# Patient Record
Sex: Female | Born: 1995 | Race: Black or African American | Hispanic: No | Marital: Single | Smoking: Never smoker
Health system: Southern US, Community
[De-identification: ages and names within clinical notes are randomized; demographics above are authoritative.]

## PROBLEM LIST (undated history)

## (undated) DIAGNOSIS — J45909 Unspecified asthma, uncomplicated: Secondary | ICD-10-CM

## (undated) HISTORY — DX: Unspecified asthma, uncomplicated: J45.909

---
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
## 2014-08-04 ENCOUNTER — Other Ambulatory Visit: Payer: Self-pay | Admitting: Obstetrics

## 2014-08-04 DIAGNOSIS — N898 Other specified noninflammatory disorders of vagina: Secondary | ICD-10-CM

## 2014-08-04 DIAGNOSIS — N361 Urethral diverticulum: Secondary | ICD-10-CM

## 2014-08-15 ENCOUNTER — Ambulatory Visit
Admission: RE | Admit: 2014-08-15 | Discharge: 2014-08-15 | Disposition: A | Discharge status: Home / Self Care | Payer: 59 | Source: Ambulatory Visit | Attending: Obstetrics | Admitting: Obstetrics

## 2014-08-15 DIAGNOSIS — N898 Other specified noninflammatory disorders of vagina: Secondary | ICD-10-CM

## 2014-08-15 DIAGNOSIS — N361 Urethral diverticulum: Secondary | ICD-10-CM

## 2014-08-15 MED ORDER — GADOBENATE DIMEGLUMINE 529 MG/ML IV SOLN
13.0000 mL | Freq: Once | INTRAVENOUS | Status: AC | PRN
Start: 2014-08-15 — End: 2014-08-15
  Administered 2014-08-15: 13 mL via INTRAVENOUS

## 2014-10-19 ENCOUNTER — Encounter (HOSPITAL_COMMUNITY): Payer: Self-pay | Admitting: *Deleted

## 2014-10-20 ENCOUNTER — Other Ambulatory Visit: Payer: Self-pay | Admitting: Obstetrics

## 2014-10-25 ENCOUNTER — Ambulatory Visit (HOSPITAL_COMMUNITY): Payer: 59 | Admitting: Anesthesiology

## 2014-10-25 ENCOUNTER — Ambulatory Visit (HOSPITAL_COMMUNITY)
Admission: RE | Admit: 2014-10-25 | Discharge: 2014-10-25 | Disposition: A | Discharge status: Home / Self Care | Payer: 59 | Source: Ambulatory Visit | Attending: Obstetrics | Admitting: Obstetrics

## 2014-10-25 ENCOUNTER — Encounter (HOSPITAL_COMMUNITY)
Admission: RE | Disposition: A | Discharge status: Home / Self Care | Payer: Self-pay | Source: Ambulatory Visit | Attending: Obstetrics

## 2014-10-25 DIAGNOSIS — J45909 Unspecified asthma, uncomplicated: Secondary | ICD-10-CM | POA: Insufficient documentation

## 2014-10-25 DIAGNOSIS — N898 Other specified noninflammatory disorders of vagina: Secondary | ICD-10-CM | POA: Diagnosis present

## 2014-10-25 HISTORY — PX: INCISION AND DRAINAGE ABSCESS: SHX5864

## 2014-10-25 LAB — PREGNANCY, URINE: PREG TEST UR: NEGATIVE

## 2014-10-25 LAB — CBC
HEMATOCRIT: 39.2 % (ref 36.0–46.0)
Hemoglobin: 13.8 g/dL (ref 12.0–15.0)
MCH: 30.3 pg (ref 26.0–34.0)
MCHC: 35.2 g/dL (ref 30.0–36.0)
MCV: 86.2 fL (ref 78.0–100.0)
Platelets: 279 10*3/uL (ref 150–400)
RBC: 4.55 MIL/uL (ref 3.87–5.11)
RDW: 12.6 % (ref 11.5–15.5)
WBC: 3.4 10*3/uL — AB (ref 4.0–10.5)

## 2014-10-25 SURGERY — INCISION AND DRAINAGE, ABSCESS
Anesthesia: General | Site: Perineum | Laterality: Left

## 2014-10-25 MED ORDER — FENTANYL CITRATE 0.05 MG/ML IJ SOLN
INTRAMUSCULAR | Status: AC
  Filled 2014-10-25: qty 2

## 2014-10-25 MED ORDER — CEFAZOLIN SODIUM-DEXTROSE 2-3 GM-% IV SOLR
2.0000 g | Freq: Once | INTRAVENOUS | Status: AC
  Administered 2014-10-25: 2 g via INTRAVENOUS
  Filled 2014-10-25: qty 50

## 2014-10-25 MED ORDER — BUPIVACAINE-EPINEPHRINE 0.5% -1:200000 IJ SOLN
INTRAMUSCULAR | Status: DC | PRN
  Administered 2014-10-25: 1 mL

## 2014-10-25 MED ORDER — BUPIVACAINE HCL 0.25 % IJ SOLN
INTRAMUSCULAR | Status: DC | PRN
  Administered 2014-10-25: 6 mL

## 2014-10-25 MED ORDER — DEXAMETHASONE SODIUM PHOSPHATE 4 MG/ML IJ SOLN
INTRAMUSCULAR | Status: AC
  Filled 2014-10-25: qty 1

## 2014-10-25 MED ORDER — PROPOFOL 10 MG/ML IV BOLUS
INTRAVENOUS | Status: DC | PRN
  Administered 2014-10-25: 180 mg via INTRAVENOUS

## 2014-10-25 MED ORDER — DEXAMETHASONE SODIUM PHOSPHATE 10 MG/ML IJ SOLN
INTRAMUSCULAR | Status: DC | PRN
  Administered 2014-10-25: 4 mg via INTRAVENOUS

## 2014-10-25 MED ORDER — MIDAZOLAM HCL 2 MG/2ML IJ SOLN
INTRAMUSCULAR | Status: DC | PRN
  Administered 2014-10-25: 2 mg via INTRAVENOUS

## 2014-10-25 MED ORDER — CEFAZOLIN SODIUM-DEXTROSE 2-3 GM-% IV SOLR
INTRAVENOUS | Status: AC
  Filled 2014-10-25: qty 50

## 2014-10-25 MED ORDER — ONDANSETRON HCL 4 MG/2ML IJ SOLN
INTRAMUSCULAR | Status: DC | PRN
  Administered 2014-10-25: 4 mg via INTRAVENOUS

## 2014-10-25 MED ORDER — FENTANYL CITRATE 0.05 MG/ML IJ SOLN
INTRAMUSCULAR | Status: DC | PRN
  Administered 2014-10-25 (×2): 50 ug via INTRAVENOUS
  Administered 2014-10-25: 100 ug via INTRAVENOUS

## 2014-10-25 MED ORDER — ACETAMINOPHEN 160 MG/5ML PO SOLN
975.0000 mg | Freq: Four times a day (QID) | ORAL | Status: AC | PRN
  Administered 2014-10-25: 975 mg via ORAL

## 2014-10-25 MED ORDER — SCOPOLAMINE 1 MG/3DAYS TD PT72
MEDICATED_PATCH | TRANSDERMAL | Status: AC
  Administered 2014-10-25: 1.5 mg via TRANSDERMAL
  Filled 2014-10-25: qty 1

## 2014-10-25 MED ORDER — METHYLENE BLUE 1 % INJ SOLN
INTRAMUSCULAR | Status: AC
  Filled 2014-10-25: qty 1

## 2014-10-25 MED ORDER — PROPOFOL 10 MG/ML IV BOLUS
INTRAVENOUS | Status: AC
  Filled 2014-10-25: qty 20

## 2014-10-25 MED ORDER — ONDANSETRON HCL 4 MG/2ML IJ SOLN
INTRAMUSCULAR | Status: AC
  Filled 2014-10-25: qty 2

## 2014-10-25 MED ORDER — LACTATED RINGERS IV SOLN
INTRAVENOUS | Status: DC
  Administered 2014-10-25 (×2): via INTRAVENOUS

## 2014-10-25 MED ORDER — ACETAMINOPHEN 160 MG/5ML PO SOLN
ORAL | Status: AC
Start: 2014-10-25 — End: 2014-10-25
  Administered 2014-10-25: 975 mg via ORAL
  Filled 2014-10-25: qty 40.6

## 2014-10-25 MED ORDER — MIDAZOLAM HCL 2 MG/2ML IJ SOLN
INTRAMUSCULAR | Status: AC
  Filled 2014-10-25: qty 2

## 2014-10-25 MED ORDER — SCOPOLAMINE 1 MG/3DAYS TD PT72
1.0000 | MEDICATED_PATCH | Freq: Once | TRANSDERMAL | Status: DC
  Administered 2014-10-25: 1.5 mg via TRANSDERMAL

## 2014-10-25 MED ORDER — BUPIVACAINE HCL (PF) 0.25 % IJ SOLN
INTRAMUSCULAR | Status: AC
  Filled 2014-10-25: qty 30

## 2014-10-25 MED ORDER — BUPIVACAINE LIPOSOME 1.3 % IJ SUSP
20.0000 mL | Freq: Once | INTRAMUSCULAR | Status: DC
  Filled 2014-10-25: qty 20

## 2014-10-25 MED ORDER — LIDOCAINE HCL (CARDIAC) 20 MG/ML IV SOLN
INTRAVENOUS | Status: AC
  Filled 2014-10-25: qty 5

## 2014-10-25 MED ORDER — FENTANYL CITRATE 0.05 MG/ML IJ SOLN: 25.0000 ug | INTRAMUSCULAR | Status: DC | PRN

## 2014-10-25 MED ORDER — LIDOCAINE HCL (CARDIAC) 20 MG/ML IV SOLN
INTRAVENOUS | Status: DC | PRN
  Administered 2014-10-25: 60 mg via INTRAVENOUS

## 2014-10-25 MED ORDER — OXYCODONE-ACETAMINOPHEN 5-325 MG PO TABS: 1.0000 | ORAL_TABLET | Freq: Four times a day (QID) | ORAL | Status: DC | PRN

## 2014-10-25 MED ORDER — BUPIVACAINE-EPINEPHRINE (PF) 0.5% -1:200000 IJ SOLN
INTRAMUSCULAR | Status: AC
  Filled 2014-10-25: qty 30

## 2014-10-25 MED ORDER — KETOROLAC TROMETHAMINE 30 MG/ML IJ SOLN
INTRAMUSCULAR | Status: AC
  Filled 2014-10-25: qty 1

## 2014-10-25 SURGICAL SUPPLY — 34 items
BLADE SURG 11 STRL SS (BLADE) ×3 IMPLANT
CLOTH BEACON ORANGE TIMEOUT ST (SAFETY) ×3 IMPLANT
CONTAINER PREFILL 10% NBF 15ML (MISCELLANEOUS) IMPLANT
COUNTER NEEDLE 1200 MAGNETIC (NEEDLE) IMPLANT
DRAIN PENROSE 1/4X12 LTX (DRAIN) IMPLANT
ELECT REM PT RETURN 9FT ADLT (ELECTROSURGICAL) ×3
ELECTRODE REM PT RTRN 9FT ADLT (ELECTROSURGICAL) ×1 IMPLANT
GAUZE IODOFORM PACK 1/2 7832 (GAUZE/BANDAGES/DRESSINGS) IMPLANT
GAUZE PACKING 1/4X5YD (GAUZE/BANDAGES/DRESSINGS) IMPLANT
GAUZE SPONGE 4X4 16PLY XRAY LF (GAUZE/BANDAGES/DRESSINGS) ×3 IMPLANT
GLOVE BIO SURGEON STRL SZ 6.5 (GLOVE) ×2 IMPLANT
GLOVE BIO SURGEONS STRL SZ 6.5 (GLOVE) ×1
GLOVE BIOGEL PI IND STRL 7.0 (GLOVE) ×2 IMPLANT
GLOVE BIOGEL PI INDICATOR 7.0 (GLOVE) ×4
GOWN STRL REUS W/TWL LRG LVL3 (GOWN DISPOSABLE) ×6 IMPLANT
NEEDLE HYPO 25X1 1.5 SAFETY (NEEDLE) ×3 IMPLANT
NS IRRIG 1000ML POUR BTL (IV SOLUTION) ×3 IMPLANT
PACK VAGINAL MINOR WOMEN LF (CUSTOM PROCEDURE TRAY) ×3 IMPLANT
PAD OB MATERNITY 4.3X12.25 (PERSONAL CARE ITEMS) ×3 IMPLANT
PAD PREP 24X48 CUFFED NSTRL (MISCELLANEOUS) ×3 IMPLANT
PENCIL BUTTON HOLSTER BLD 10FT (ELECTRODE) ×3 IMPLANT
SUT VIC AB 2-0 CT2 27 (SUTURE) ×3 IMPLANT
SUT VICRYL 3 0 CT 3 (SUTURE) ×3 IMPLANT
SUT VICRYL 4-0 PS2 18IN ABS (SUTURE) ×3 IMPLANT
SWAB COLLECTION DEVICE MRSA (MISCELLANEOUS) IMPLANT
SWAB CULTURE LIQ STUART DBL (MISCELLANEOUS) ×3 IMPLANT
SYR 30ML LL (SYRINGE) ×6 IMPLANT
SYR BULB IRRIGATION 50ML (SYRINGE) ×3 IMPLANT
TOWEL OR 17X24 6PK STRL BLUE (TOWEL DISPOSABLE) ×6 IMPLANT
TUBE ANAEROBIC SPECIMEN COL (MISCELLANEOUS) ×3 IMPLANT
TUBING NON-CON 1/4 X 20 CONN (TUBING) ×2 IMPLANT
TUBING NON-CON 1/4 X 20' CONN (TUBING) ×1
WATER STERILE IRR 1000ML POUR (IV SOLUTION) ×3 IMPLANT
YANKAUER SUCT BULB TIP NO VENT (SUCTIONS) ×3 IMPLANT

## 2014-10-25 NOTE — Transfer of Care (Signed)
Immediate Anesthesia Transfer of Care Note  Patient: Jessica Burnett  Procedure(s) Performed: Procedure(s): INCISION AND DRAINAGE ABSCESS/Removal of Left Skene's Gland Cyst (Left)  Patient Location: PACU  Anesthesia Type:General  Level of Consciousness: sedated  Airway & Oxygen Therapy: Patient Spontanous Breathing and Patient connected to nasal cannula oxygen  Post-op Assessment: Report given to RN and Post -op Vital signs reviewed and stable  Post vital signs: stable  Last Vitals:  Filed Vitals:   10/25/14 1224  BP: 114/64  Pulse: 51  Temp: 36.4 C  Resp: 18    Complications: No apparent anesthesia complications

## 2014-10-25 NOTE — Anesthesia Preprocedure Evaluation (Signed)
Anesthesia Evaluation  Patient identified by MRN, date of birth, ID band Patient awake    Reviewed: Allergy & Precautions, H&P , Patient's Chart, lab work & pertinent test results, reviewed documented beta blocker date and time   Airway Mallampati: II  TM Distance: >3 FB Neck ROM: full    Dental no notable dental hx.    Pulmonary asthma ,  breath sounds clear to auscultation  Pulmonary exam normal       Cardiovascular Rhythm:regular Rate:Normal     Neuro/Psych    GI/Hepatic   Endo/Other    Renal/GU      Musculoskeletal   Abdominal   Peds  Hematology   Anesthesia Other Findings   Reproductive/Obstetrics                             Anesthesia Physical Anesthesia Plan  ASA: II  Anesthesia Plan:    Post-op Pain Management:    Induction: Intravenous  Airway Management Planned: LMA  Additional Equipment:   Intra-op Plan:   Post-operative Plan:   Informed Consent: I have reviewed the patients History and Physical, chart, labs and discussed the procedure including the risks, benefits and alternatives for the proposed anesthesia with the patient or authorized representative who has indicated his/her understanding and acceptance.   Dental Advisory Given and Dental advisory given  Plan Discussed with: CRNA and Surgeon  Anesthesia Plan Comments: (Discussed GA with LMA, possible sore throat, potential need to switch to ETT, N/V, pulmonary aspiration. Questions answered. )        Anesthesia Quick Evaluation

## 2014-10-25 NOTE — Anesthesia Postprocedure Evaluation (Signed)
  Anesthesia Post-op Note  Patient: Jessica Burnett  Procedure(s) Performed: Procedure(s): INCISION AND DRAINAGE ABSCESS/Removal of Left Skene's Gland Cyst (Left) Patient is awake and responsive. Pain and nausea are reasonably well controlled. Vital signs are stable and clinically acceptable. Oxygen saturation is clinically acceptable. There are no apparent anesthetic complications at this time. Patient is ready for discharge.

## 2014-10-25 NOTE — Anesthesia Procedure Notes (Signed)
Procedure Name: LMA Insertion Date/Time: 10/25/2014 1:41 PM Performed by: Donnalee CurryMALINOVA, Atif Chapple HRISTOVA Pre-anesthesia Checklist: Patient identified, Emergency Drugs available, Suction available and Patient being monitored Patient Re-evaluated:Patient Re-evaluated prior to inductionOxygen Delivery Method: Circle system utilized Preoxygenation: Pre-oxygenation with 100% oxygen Intubation Type: IV induction Ventilation: Mask ventilation without difficulty LMA: LMA inserted LMA Size: 4.0 Tube secured with: Tape Dental Injury: Teeth and Oropharynx as per pre-operative assessment

## 2014-10-25 NOTE — Op Note (Signed)
10/25/2014  3:31 PM  PATIENT:  Jessica Burnett  18 y.o. female  PRE-OPERATIVE DIAGNOSIS:  Vaginal Wall cyst of unclear etiology,  Pain, Swelling  POST-OPERATIVE DIAGNOSIS:  Vaginal Wall cyst of unclear etiology with abscess,  Pain, Swelling  PROCEDURE:  Excision of Vaginal wall cyst  SURGEON:  Surgeon(s) and Role:    * Noland Fordyce, MD - Primary  PHYSICIAN ASSISTANT: none  ASSISTANTS: none   ANESTHESIA:   local and general  EBL:  Total I/O In: 1800 [I.V.:1800] Out: 200 [Urine:125; Blood:75]  BLOOD ADMINISTERED:none  DRAINS: none   LOCAL MEDICATIONS USED:  LIDOCAINE   SPECIMEN:  Source of Specimen:  vaginal wall cyst  DISPOSITION OF SPECIMEN:  PATHOLOGY  COUNTS:  YES  TOURNIQUET:  * No tourniquets in log *  DICTATION: .Dragon Dictation  PLAN OF CARE: Discharge to home after PACU  PATIENT DISPOSITION:  PACU - hemodynamically stable.   Delay start of Pharmacological VTE agent (>24hrs) due to surgical blood loss or risk of bleeding: yes   Antibiotics: 2 g Ancef Complications: None Findings: 3 cm soft anterior vaginal wall cyst extending from the medial urethra to the lateral labia minora. Cyst filled with purulent fluid without odor or blood. Intact urethra post procedure.  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 Given symptomatic pain and swelling causing immobility twice a year patient opted for removal of cystic lesion of unclear etiology to reduce recurrence.  Procedure: After informed sensitivity and patient taken the operating room where general anesthesia was machine without typically she is prepped and draped in normal sterile fashion in the dorsal supine lithotomy position. A Foley catheter was inserted into the bladder. Traction was used on the Foley to help identify the course of urethra which was then deviated away from the lesion. Surgical mapping was carried out in the  planned incision was marked with a marking pen. Half percent Marcaine with 1 200,000 units of epinephrine was infused under the planned incision site. A 1-1/2 cm incision was made with a #15 blade in the medial aspect of the left labia minora. Using Metzenbaum scissors and retraction both with DeBakey forceps and Allis clamps as well as blunt dissection the 3 cm cyst was dissected out from the underlying tissue. Time for dissection was about 1 hour. Great care was taken to avoid entering the urethra which was located on the medial aspect of this cystic lesion. Very minimal Bovie cautery use was had on the lateral aspect of the dissection to control any bleeding but no cautery was used on the medial aspect at the border of the urethra. Just prior to completely dissecting out the cyst with the last area of attachment medial and deep,  the cyst wall was inadvertently entered and drainage of purulent fluid. Copious irrigation was done. The remainder of the cyst was dissected out completely. At this point the urethra was backfilled through the Foley catheter with methylene blue and saline. No blue dye was seen entering the dissection area. Certainly the Foley catheter was not seen either. It was felt that the urethra was not entered during the careful dissection of the cyst wall. Once the cyst was removed a potential space was left and this was closed in 3 layer closure with 2-0 Vicryl repeat in interrupted figure-of-eight sutures. Hemostasis was noted. A 3-0 Vicryl was used to close the medial labial incision and 2 additional figure-of-eight's with a 4-0 Vicryl were used.  10 cc of 1% plain lidocaine was infused at the end of the procedure. The urethra was palpated again, the vagina was palpated and and no inadvertent injury was noted good cosmetic appearance at the end of the procedure. Sponge and needle counts were correct 3 and patient was taken to the recovery room in a stable condition.  Jessica Burnett  A. 10/25/2014 3:46 PM

## 2014-10-25 NOTE — H&P (Signed)
CC: for removal of L and possibly R vaginal wall cyst  HPI: 19 yo G0 with several year h/o recurrent anterior vaginal wall cyst. L>R, swells and painful twice yearly. Unclear etiology. 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 NO evidence for urethral diverticulum. MRI shows cystic lesions in anterior vaginal wall, at level of urethra- R: 2.9 x 1.8c cm; L 9 x 8 mm.   Past Medical History  Diagnosis Date  . Asthma     History reviewed. No pertinent past surgical history.   NKDA  PE: Filed Vitals:   10/19/14 1038 10/25/14 1224  BP:  114/64  Pulse:  51  Temp:  97.5 F (36.4 C)  TempSrc:  Oral  Resp:  18  Height: 5\' 2"  (1.575 m)   Weight: 68.04 kg (150 lb)   SpO2:  100%   Gen: well appearing, no distress Abd: soft, NT, ND GU: def to OR LE: no edema  CBC    Component Value Date/Time   WBC 3.4* 10/25/2014 1220   RBC 4.55 10/25/2014 1220   HGB 13.8 10/25/2014 1220   HCT 39.2 10/25/2014 1220   PLT 279 10/25/2014 1220   MCV 86.2 10/25/2014 1220   MCH 30.3 10/25/2014 1220   MCHC 35.2 10/25/2014 1220   RDW 12.6 10/25/2014 1220    A/P:: 19 yo G0 for removal of vaginal wall cyst. Pt aware risks pain, bleeding, scarring, dyspareunia, injury to bladder and urethra.  Chinita Schimpf A. 10/25/2014 1:32 PM

## 2014-10-25 NOTE — Brief Op Note (Signed)
10/25/2014  3:31 PM  PATIENT:  Jessica Burnett  18 y.o. female  PRE-OPERATIVE DIAGNOSIS:  Vaginal Wall cyst of unclear etiology,  Pain, Swelling  POST-OPERATIVE DIAGNOSIS:  Vaginal Wall cyst of unclear etiology with abscess,  Pain, Swelling  PROCEDURE:  Excision of Vaginal wall cyst  SURGEON:  Surgeon(s) and Role:    * Noland FordyceKelly Jannely Henthorn, MD - Primary  PHYSICIAN ASSISTANT: none  ASSISTANTS: none   ANESTHESIA:   local and general  EBL:  Total I/O In: 1800 [I.V.:1800] Out: 200 [Urine:125; Blood:75]  BLOOD ADMINISTERED:none  DRAINS: none   LOCAL MEDICATIONS USED:  LIDOCAINE   SPECIMEN:  Source of Specimen:  vaginal wall cyst  DISPOSITION OF SPECIMEN:  PATHOLOGY  COUNTS:  YES  TOURNIQUET:  * No tourniquets in log *  DICTATION: .Dragon Dictation  PLAN OF CARE: Discharge to home after PACU  PATIENT DISPOSITION:  PACU - hemodynamically stable.   Delay start of Pharmacological VTE agent (>24hrs) due to surgical blood loss or risk of bleeding: yes

## 2014-10-25 NOTE — Discharge Instructions (Signed)
Incision and Drainage Care After Refer to this sheet in the next few weeks. These instructions provide you with information on caring for yourself after your procedure. Your caregiver may also give you more specific instructions. Your treatment has been planned according to current medical practices, but problems sometimes occur. Call your caregiver if you have any problems or questions after your procedure. HOME CARE INSTRUCTIONS   If antibiotic medicine is given, take it as directed. Finish it even if you start to feel better.  Only take over-the-counter or prescription medicines for pain, discomfort, or fever as directed by your caregiver.  Keep all follow-up appointments as directed by your caregiver.  Change any bandages (dressings) as directed by your caregiver. Replace old dressings with clean dressings.  Wash your hands before and after caring for your wound. You will receive specific instructions for cleansing and caring for your wound.  SEEK MEDICAL CARE IF:   You have increased pain, swelling, or redness around the wound.  You have increased drainage, smell, or bleeding from the wound.  You have muscle aches, chills, or you feel generally sick.  You have a fever. MAKE SURE YOU:   Understand these instructions.  Will watch your condition.  Will get help right away if you are not doing well or get worse. Document Released: 10/29/2011 Document Reviewed: 10/29/2011 [facility] Patient Information 2015 [place]. This information is not intended to replace advice given to you by your health care provider. Make sure you discuss any questions you have with your health care provider.  May take sitz baths for comfort.  No pools or Jacuzzi's. (communal use) May begin ibuprofen 600 mgs every 6 hrs, or 800 mgs every 8 hrs at noon tomorrow. No aspirin No driving for 24 hrs or for as long as taking narcotic medication. Begin with light diet, advance as tolerated.  Post  Anesthesia Home Care Instructions  Activity: Get plenty of rest for the remainder of the day. A responsible adult should stay with you for 24 hours following the procedure.  For the next 24 hours, DO NOT: -Drive a car -Advertising copywriterperate machinery -Drink alcoholic beverages -Take any medication unless instructed by your physician -Make any legal decisions or sign important papers.  Meals: Regular foods as tolerated. Avoid greasy, spicy, heavy foods. If nausea and/or vomiting occur, drink only clear liquids until the nausea and/or vomiting subsides. Call your physician if vomiting continues.  Special Instructions/Symptoms: Your throat may feel dry or sore from the anesthesia or the breathing tube placed in your throat during surgery. If this causes discomfort, gargle with warm salt water. The discomfort should disappear within 24 hours.

## 2014-10-26 ENCOUNTER — Encounter (HOSPITAL_COMMUNITY): Payer: Self-pay | Admitting: Obstetrics

## 2014-10-29 LAB — CULTURE, ROUTINE-ABSCESS: CULTURE: NO GROWTH

## 2014-10-31 LAB — ANAEROBIC CULTURE

## 2015-07-20 ENCOUNTER — Encounter (HOSPITAL_COMMUNITY): Payer: Self-pay | Admitting: *Deleted

## 2015-07-22 ENCOUNTER — Other Ambulatory Visit: Payer: Self-pay | Admitting: Obstetrics

## 2015-08-03 ENCOUNTER — Encounter (HOSPITAL_COMMUNITY): Payer: Self-pay | Admitting: Anesthesiology

## 2015-08-03 NOTE — Anesthesia Preprocedure Evaluation (Addendum)
Anesthesia Evaluation  Patient identified by MRN, date of birth, ID band Patient awake    Reviewed: Allergy & Precautions, NPO status , Patient's Chart, lab work & pertinent test results  Airway Mallampati: II  TM Distance: >3 FB     Dental  (+) Teeth Intact   Pulmonary asthma ,    Pulmonary exam normal breath sounds clear to auscultation       Cardiovascular negative cardio ROS Normal cardiovascular exam Rhythm:Regular Rate:Normal     Neuro/Psych negative neurological ROS  negative psych ROS   GI/Hepatic negative GI ROS, Neg liver ROS,   Endo/Other  negative endocrine ROS  Renal/GU negative Renal ROS  negative genitourinary   Musculoskeletal negative musculoskeletal ROS (+)   Abdominal (+) - obese,   Peds  Hematology negative hematology ROS (+)   Anesthesia Other Findings   Reproductive/Obstetrics Vaginal wall cyst                            Anesthesia Physical Anesthesia Plan  ASA: I  Anesthesia Plan: General   Post-op Pain Management:    Induction: Intravenous  Airway Management Planned: LMA  Additional Equipment:   Intra-op Plan:   Post-operative Plan:   Informed Consent: I have reviewed the patients History and Physical, chart, labs and discussed the procedure including the risks, benefits and alternatives for the proposed anesthesia with the patient or authorized representative who has indicated his/her understanding and acceptance.   Dental advisory given  Plan Discussed with: CRNA, Anesthesiologist and Surgeon  Anesthesia Plan Comments:         Anesthesia Quick Evaluation

## 2015-08-04 ENCOUNTER — Ambulatory Visit (HOSPITAL_COMMUNITY)
Admission: RE | Admit: 2015-08-04 | Discharge: 2015-08-04 | Disposition: A | Discharge status: Home / Self Care | Payer: 59 | Source: Ambulatory Visit | Attending: Obstetrics | Admitting: Obstetrics

## 2015-08-04 ENCOUNTER — Ambulatory Visit (HOSPITAL_COMMUNITY): Payer: 59 | Admitting: Anesthesiology

## 2015-08-04 ENCOUNTER — Encounter (HOSPITAL_COMMUNITY)
Admission: RE | Disposition: A | Discharge status: Home / Self Care | Payer: Self-pay | Source: Ambulatory Visit | Attending: Obstetrics

## 2015-08-04 DIAGNOSIS — Q524 Other congenital malformations of vagina: Secondary | ICD-10-CM | POA: Insufficient documentation

## 2015-08-04 DIAGNOSIS — J45909 Unspecified asthma, uncomplicated: Secondary | ICD-10-CM | POA: Diagnosis not present

## 2015-08-04 HISTORY — PX: EXCISION VAGINAL CYST: SHX5825

## 2015-08-04 LAB — CBC WITH DIFFERENTIAL/PLATELET
BASOS ABS: 0 10*3/uL (ref 0.0–0.1)
Basophils Relative: 0 %
EOS ABS: 0.2 10*3/uL (ref 0.0–0.7)
EOS PCT: 6 %
HCT: 39.5 % (ref 36.0–46.0)
Hemoglobin: 13.9 g/dL (ref 12.0–15.0)
LYMPHS ABS: 1.3 10*3/uL (ref 0.7–4.0)
Lymphocytes Relative: 33 %
MCH: 30 pg (ref 26.0–34.0)
MCHC: 35.2 g/dL (ref 30.0–36.0)
MCV: 85.3 fL (ref 78.0–100.0)
Monocytes Absolute: 0.4 10*3/uL (ref 0.1–1.0)
Monocytes Relative: 9 %
NEUTROS PCT: 52 %
Neutro Abs: 2.1 10*3/uL (ref 1.7–7.7)
PLATELETS: 250 10*3/uL (ref 150–400)
RBC: 4.63 MIL/uL (ref 3.87–5.11)
RDW: 12.9 % (ref 11.5–15.5)
WBC: 3.9 10*3/uL — ABNORMAL LOW (ref 4.0–10.5)

## 2015-08-04 LAB — PREGNANCY, URINE: Preg Test, Ur: NEGATIVE

## 2015-08-04 SURGERY — EXCISION, CYST, VAGINA
Anesthesia: General | Site: Vagina | Laterality: Right

## 2015-08-04 MED ORDER — DEXAMETHASONE SODIUM PHOSPHATE 4 MG/ML IJ SOLN
INTRAMUSCULAR | Status: AC
  Filled 2015-08-04: qty 1

## 2015-08-04 MED ORDER — FENTANYL CITRATE (PF) 100 MCG/2ML IJ SOLN
INTRAMUSCULAR | Status: DC | PRN
  Administered 2015-08-04 (×2): 25 ug via INTRAVENOUS
  Administered 2015-08-04 (×2): 50 ug via INTRAVENOUS
  Administered 2015-08-04 (×2): 25 ug via INTRAVENOUS

## 2015-08-04 MED ORDER — PHENYLEPHRINE 40 MCG/ML (10ML) SYRINGE FOR IV PUSH (FOR BLOOD PRESSURE SUPPORT)
PREFILLED_SYRINGE | INTRAVENOUS | Status: AC
  Filled 2015-08-04: qty 10

## 2015-08-04 MED ORDER — GLYCOPYRROLATE 0.2 MG/ML IJ SOLN
INTRAMUSCULAR | Status: AC
Start: 2015-08-04 — End: 2015-08-04
  Filled 2015-08-04: qty 1

## 2015-08-04 MED ORDER — BUPIVACAINE-EPINEPHRINE 0.5% -1:200000 IJ SOLN
INTRAMUSCULAR | Status: DC | PRN
  Administered 2015-08-04: 8 mL
  Administered 2015-08-04: 7 mL
  Administered 2015-08-04: 3 mL

## 2015-08-04 MED ORDER — NORGESTIMATE-ETH ESTRADIOL 0.25-35 MG-MCG PO TABS
1.0000 | ORAL_TABLET | Freq: Every day | ORAL | Status: DC
Start: 2015-08-04 — End: 2019-10-07

## 2015-08-04 MED ORDER — FENTANYL CITRATE (PF) 100 MCG/2ML IJ SOLN: 25.0000 ug | INTRAMUSCULAR | Status: DC | PRN

## 2015-08-04 MED ORDER — PROPOFOL 10 MG/ML IV BOLUS
INTRAVENOUS | Status: AC
  Filled 2015-08-04: qty 40

## 2015-08-04 MED ORDER — LIDOCAINE HCL (CARDIAC) 20 MG/ML IV SOLN
INTRAVENOUS | Status: AC
  Filled 2015-08-04: qty 5

## 2015-08-04 MED ORDER — SCOPOLAMINE 1 MG/3DAYS TD PT72
1.0000 | MEDICATED_PATCH | Freq: Once | TRANSDERMAL | Status: DC
  Administered 2015-08-04: 1.5 mg via TRANSDERMAL

## 2015-08-04 MED ORDER — BUPIVACAINE-EPINEPHRINE (PF) 0.5% -1:200000 IJ SOLN
INTRAMUSCULAR | Status: AC
  Filled 2015-08-04: qty 30

## 2015-08-04 MED ORDER — PHENYLEPHRINE HCL 10 MG/ML IJ SOLN
INTRAMUSCULAR | Status: DC | PRN
  Administered 2015-08-04 (×2): 40 ug via INTRAVENOUS

## 2015-08-04 MED ORDER — MEPERIDINE HCL 25 MG/ML IJ SOLN: 6.2500 mg | INTRAMUSCULAR | Status: DC | PRN

## 2015-08-04 MED ORDER — DEXAMETHASONE SODIUM PHOSPHATE 4 MG/ML IJ SOLN
INTRAMUSCULAR | Status: DC | PRN
  Administered 2015-08-04: 8 mg via INTRAVENOUS

## 2015-08-04 MED ORDER — PROPOFOL 10 MG/ML IV BOLUS
INTRAVENOUS | Status: AC
  Filled 2015-08-04: qty 20

## 2015-08-04 MED ORDER — MIDAZOLAM HCL 2 MG/2ML IJ SOLN
INTRAMUSCULAR | Status: AC
  Filled 2015-08-04: qty 2

## 2015-08-04 MED ORDER — OXYCODONE-ACETAMINOPHEN 5-325 MG PO TABS: 2.0000 | ORAL_TABLET | Freq: Four times a day (QID) | ORAL | Status: DC | PRN

## 2015-08-04 MED ORDER — CEFAZOLIN SODIUM-DEXTROSE 2-3 GM-% IV SOLR
INTRAVENOUS | Status: AC
  Administered 2015-08-04: 2 g via INTRAVENOUS
  Filled 2015-08-04: qty 50

## 2015-08-04 MED ORDER — SCOPOLAMINE 1 MG/3DAYS TD PT72
MEDICATED_PATCH | TRANSDERMAL | Status: AC
  Administered 2015-08-04: 1.5 mg via TRANSDERMAL
  Filled 2015-08-04: qty 1

## 2015-08-04 MED ORDER — CEFAZOLIN SODIUM-DEXTROSE 2-3 GM-% IV SOLR: 2.0000 g | INTRAVENOUS | Status: DC

## 2015-08-04 MED ORDER — MIDAZOLAM HCL 2 MG/2ML IJ SOLN
INTRAMUSCULAR | Status: DC | PRN
  Administered 2015-08-04: 2 mg via INTRAVENOUS

## 2015-08-04 MED ORDER — ONDANSETRON HCL 4 MG/2ML IJ SOLN
INTRAMUSCULAR | Status: DC | PRN
  Administered 2015-08-04: 4 mg via INTRAVENOUS

## 2015-08-04 MED ORDER — LIDOCAINE HCL (CARDIAC) 20 MG/ML IV SOLN
INTRAVENOUS | Status: DC | PRN
  Administered 2015-08-04: 80 mg via INTRAVENOUS

## 2015-08-04 MED ORDER — LACTATED RINGERS IV SOLN
INTRAVENOUS | Status: DC
  Administered 2015-08-04 (×4): via INTRAVENOUS

## 2015-08-04 MED ORDER — ONDANSETRON HCL 4 MG/2ML IJ SOLN
INTRAMUSCULAR | Status: AC
  Filled 2015-08-04: qty 2

## 2015-08-04 MED ORDER — METHYLENE BLUE 1 % INJ SOLN
INTRAMUSCULAR | Status: AC
  Filled 2015-08-04: qty 10

## 2015-08-04 MED ORDER — FENTANYL CITRATE (PF) 250 MCG/5ML IJ SOLN
INTRAMUSCULAR | Status: AC
  Filled 2015-08-04: qty 5

## 2015-08-04 MED ORDER — PROPOFOL 10 MG/ML IV BOLUS
INTRAVENOUS | Status: DC | PRN
  Administered 2015-08-04: 150 mg via INTRAVENOUS

## 2015-08-04 MED ORDER — GLYCOPYRROLATE 0.2 MG/ML IJ SOLN
INTRAMUSCULAR | Status: DC | PRN
  Administered 2015-08-04: 0.2 mg via INTRAVENOUS

## 2015-08-04 MED ORDER — HYDROCODONE-ACETAMINOPHEN 7.5-325 MG PO TABS: 1.0000 | ORAL_TABLET | Freq: Once | ORAL | Status: DC | PRN

## 2015-08-04 MED ORDER — KETOROLAC TROMETHAMINE 30 MG/ML IJ SOLN
INTRAMUSCULAR | Status: AC
  Filled 2015-08-04: qty 1

## 2015-08-04 MED ORDER — PROMETHAZINE HCL 25 MG/ML IJ SOLN: 6.2500 mg | INTRAMUSCULAR | Status: DC | PRN

## 2015-08-04 MED ORDER — FENTANYL CITRATE (PF) 250 MCG/5ML IJ SOLN
INTRAMUSCULAR | Status: AC
Start: 2015-08-04 — End: 2015-08-04
  Filled 2015-08-04: qty 5

## 2015-08-04 SURGICAL SUPPLY — 32 items
APPLICATOR COTTON TIP 6IN STRL (MISCELLANEOUS) IMPLANT
BLADE SURG 11 STRL SS (BLADE) ×3 IMPLANT
BLADE SURG 15 STRL LF C SS BP (BLADE) ×1 IMPLANT
BLADE SURG 15 STRL SS (BLADE) ×2
CATH BARTHOLIN GLAND 10FR 5CM (CATHETERS) ×3 IMPLANT
CLOTH BEACON ORANGE TIMEOUT ST (SAFETY) ×3 IMPLANT
CONTAINER PREFILL 10% NBF 15ML (MISCELLANEOUS) ×3 IMPLANT
COUNTER NEEDLE 1200 MAGNETIC (NEEDLE) ×3 IMPLANT
ELECT REM PT RETURN 9FT ADLT (ELECTROSURGICAL) ×3
ELECTRODE REM PT RTRN 9FT ADLT (ELECTROSURGICAL) ×1 IMPLANT
GAUZE IODOFORM PACK 1/2 7832 (GAUZE/BANDAGES/DRESSINGS) IMPLANT
GAUZE PACKING 1/4X5YD (GAUZE/BANDAGES/DRESSINGS) IMPLANT
GLOVE BIOGEL M 6.5 STRL (GLOVE) ×6 IMPLANT
GLOVE BIOGEL PI IND STRL 7.0 (GLOVE) ×2 IMPLANT
GLOVE BIOGEL PI INDICATOR 7.0 (GLOVE) ×4
GOWN STRL REUS W/TWL LRG LVL3 (GOWN DISPOSABLE) ×6 IMPLANT
NEEDLE HYPO 22GX1.5 SAFETY (NEEDLE) ×3 IMPLANT
NS IRRIG 1000ML POUR BTL (IV SOLUTION) IMPLANT
PACK VAGINAL MINOR WOMEN LF (CUSTOM PROCEDURE TRAY) ×3 IMPLANT
PAD OB MATERNITY 4.3X12.25 (PERSONAL CARE ITEMS) ×3 IMPLANT
PENCIL BUTTON HOLSTER BLD 10FT (ELECTRODE) ×3 IMPLANT
SPONGE SURGIFOAM ABS GEL 12-7 (HEMOSTASIS) ×3 IMPLANT
SUT VIC AB 2-0 CT2 27 (SUTURE) IMPLANT
SUT VIC AB 3-0 SH 27 (SUTURE) ×2
SUT VIC AB 3-0 SH 27X BRD (SUTURE) ×1 IMPLANT
SYR BULB IRRIGATION 50ML (SYRINGE) IMPLANT
SYRINGE IRR TOOMEY STRL 70CC (SYRINGE) ×3 IMPLANT
TOWEL OR 17X24 6PK STRL BLUE (TOWEL DISPOSABLE) ×6 IMPLANT
TRAY FOLEY CATH SILVER 14FR (SET/KITS/TRAYS/PACK) ×3 IMPLANT
TUBING NON-CON 1/4 X 20 CONN (TUBING) ×2 IMPLANT
TUBING NON-CON 1/4 X 20' CONN (TUBING) ×1
YANKAUER SUCT BULB TIP NO VENT (SUCTIONS) ×3 IMPLANT

## 2015-08-04 NOTE — Discharge Instructions (Signed)

## 2015-08-04 NOTE — Anesthesia Procedure Notes (Signed)
Procedure Name: LMA Insertion Date/Time: 08/04/2015 9:41 AM Performed by: Earmon PhoenixWILKERSON, Maximina Pirozzi P Pre-anesthesia Checklist: Patient identified, Patient being monitored, Timeout performed, Emergency Drugs available and Suction available Patient Re-evaluated:Patient Re-evaluated prior to inductionOxygen Delivery Method: Circle system utilized Preoxygenation: Pre-oxygenation with 100% oxygen Intubation Type: IV induction Ventilation: Mask ventilation without difficulty LMA: LMA inserted LMA Size: 4.0 Number of attempts: 1 Placement Confirmation: ETT inserted through vocal cords under direct vision,  positive ETCO2,  CO2 detector and breath sounds checked- equal and bilateral Tube secured with: Tape Dental Injury: Teeth and Oropharynx as per pre-operative assessment

## 2015-08-04 NOTE — Transfer of Care (Signed)
Immediate Anesthesia Transfer of Care Note  Patient: Jessica Burnett  Procedure(s) Performed: Procedure(s): EXCISION RIGHT VAGINAL Wall CYST (Right)  Patient Location: PACU  Anesthesia Type:General  Level of Consciousness: awake  Airway & Oxygen Therapy: Patient Spontanous Breathing  Post-op Assessment: Report given to PACU RN  Post vital signs: stable  Filed Vitals:   08/04/15 0841  BP: 98/63  Pulse: 56  Temp: 36.8 C  Resp: 18    Complications: No apparent anesthesia complications

## 2015-08-04 NOTE — Op Note (Signed)
PATIENT: Jessica Burnett 19 y.o. 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, Pain, Swelling  POST-OPERATIVE DIAGNOSIS: Vaginal Wall cyst of  likely Mullerian etiology,, Pain, Swelling  PROCEDURE: Excision of Vaginal wall cyst  SURGEON: Surgeon(s) and Role:  * Noland FordyceKelly Kristalyn Bergstresser, MD - Primary  PHYSICIAN ASSISTANT: none  ASSISTANTS: none   ANESTHESIA: local and general  EBL: 350cc  BLOOD ADMINISTERED:none  DRAINS: foley  LOCAL MEDICATIONS USED: 1/2% marcaine with 1:200k epi, 20 cc  SPECIMEN: Source of Specimen: vaginal wall cyst  DISPOSITION OF SPECIMEN: PATHOLOGY  COUNTS: YES  TOURNIQUET: * No tourniquets in log *  DICTATION: .Dragon Dictation  PLAN OF CARE: Discharge to home after PACU  PATIENT DISPOSITION: PACU - hemodynamically stable.  Delay start of Pharmacological VTE agent (>24hrs) due to surgical blood loss or risk of bleeding: yes   Antibiotics: 2 g Ancef Complications: None Findings: 3 cm soft anterior vaginal wall cyst extending from the medial urethra to the lateral right labia minora. Cyst filled with purulent fluid without odor or blood. Intact urethra post procedure.  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 S/p resection on left side and desiring resection on right side. Given symptomatic pain and swelling causing immobility twice a year patient opted for removal of cystic lesion of unclear etiology to reduce recurrence.  Procedure: After informed sensitivity and patient taken the operating room where general anesthesia was initiated she is prepped and draped in normal sterile fashion in the dorsal supine lithotomy position. A Foley catheter was inserted into the bladder. Traction was used on the Foley to help identify the course of urethra which was then deviated away from the lesion. Surgical mapping was carried  out in the planned incision was marked with a marking pen. Half percent Marcaine with 1 200,000 units of epinephrine was infused under the planned incision site. A 1-1/2 cm incision was made with a #15 blade in the medial aspect of the left labia minora. The cyst ruptured almost immediately with drainage of a purulent material. A word catheter was placed into the cyst for better visualization of the borders.  Using Metzenbaum scissors and retraction both with DeBakey forceps and Allis clamps as well as blunt dissection the 3 cm cyst was dissected out from the underlying tissue. Time for dissection was about 1 hour. Great care was taken to avoid entering the urethra which was located on the medial aspect of this cystic lesion. Very minimal Bovie cautery use was had on the lateral aspect of the dissection to control any bleeding but no cautery was used on the medial aspect at the border of the urethra.  Copious irrigation was done. The cyst was dissected out completely. At this point the urethra was backfilled through the Foley catheter with methylene blue and saline. No blue dye was seen entering the dissection area. Certainly the Foley catheter was not seen either. It was felt that the urethra was not entered during the careful dissection of the cyst wall. Once the cyst was removed a potential space was left and moderated bleeding throughout the case from this space. 4 figure of 8 sutures of 3-0 vicryl were used for hemostasis,  A 3-0 Vicryl was used to close the medial labial incision in a running fashion.  10 cc of injection was infused at the end of the procedure. A small labial skin tag was removed sharply after first injecting with marcaine.  Hemostasis with cautery. The urethra was palpated again, the vagina was palpated and and no inadvertent injury was noted good cosmetic appearance at the end of the procedure. Sponge and needle counts were correct 3 and patient was taken to the recovery room in a stable  condition.

## 2015-08-04 NOTE — Anesthesia Postprocedure Evaluation (Signed)
Anesthesia Post Note  Patient: Jessica Burnett  Procedure(s) Performed: Procedure(s) (LRB): EXCISION RIGHT VAGINAL Wall CYST (Right)  Patient location during evaluation: PACU Anesthesia Type: General Level of consciousness: awake and alert and oriented Pain management: pain level controlled Vital Signs Assessment: post-procedure vital signs reviewed and stable Respiratory status: spontaneous breathing, nonlabored ventilation and respiratory function stable Cardiovascular status: blood pressure returned to baseline Postop Assessment: no signs of nausea or vomiting    Last Vitals:  Filed Vitals:   08/04/15 1245 08/04/15 1300  BP: 101/62 95/59  Pulse: 58 57  Temp:    Resp: 12 12    Last Pain: There were no vitals filed for this visit.               Jolan Mealor A.

## 2015-08-04 NOTE — Brief Op Note (Signed)
08/04/2015  11:43 AM  PATIENT:  Jessica Burnett  19 y.o. female  PRE-OPERATIVE DIAGNOSIS:  Right Vaginal Wall Cyst with pain and swelling  POST-OPERATIVE DIAGNOSIS:  Right Vaginal Wall Cyst with pain and swelling  PROCEDURE:  Procedure(s): EXCISION RIGHT VAGINAL Wall CYST (Right)  SURGEON:  Surgeon(s) and Role:    * Noland FordyceKelly Asanti Craigo, MD - Primary  PHYSICIAN ASSISTANT:   ASSISTANTS: none   ANESTHESIA:   local and general  EBL:  Total I/O In: 2000 [I.V.:2000] Out: 400 [Blood:400]  BLOOD ADMINISTERED:none  DRAINS: Urinary Catheter (Foley)   LOCAL MEDICATIONS USED:  MARCAINE, 0.5% with 1/200k epineprhibne     and Amount: 20 ml  SPECIMEN:  Source of Specimen:  vaginal wall cyst  DISPOSITION OF SPECIMEN:  PATHOLOGY  COUNTS:  YES  TOURNIQUET:  * No tourniquets in log *  DICTATION: .Note written in EPIC  PLAN OF CARE: Discharge to home after PACU  PATIENT DISPOSITION:  PACU - hemodynamically stable.   Delay start of Pharmacological VTE agent (>24hrs) due to surgical blood loss or risk of bleeding: yes

## 2015-08-04 NOTE — H&P (Signed)
CC: Enlarging R Labial Mullerian Gland cyst  HPI: 19 yo NP G0 for resection of r Mullerian gland cyst. Pt with long h/o b/l labial cysts that grow about 2/ yr. 3/16 had resection of L cyst after MRI and urology evaluation. Final pathology- Mullerian gland cyst. At that time, she had a similar but smaller cyst on the R side, now enlarging and pt would like resection.  Past Medical History  Diagnosis Date  . Asthma     Past Surgical History  Procedure Laterality Date  . Incision and drainage abscess Left 10/25/2014    Procedure: INCISION AND DRAINAGE ABSCESS/Removal of Left Skene's Gland Cyst;  Surgeon: Noland FordyceKelly Makai Agostinelli, MD;  Location: [facility];  Service: Gynecology;  Laterality: Left;   NKDA Meds: albuterol prn  PE: Filed Vitals:   08/04/15 0841  BP: 98/63  Pulse: 56  Temp: 98.2 F (36.8 C)  TempSrc: Oral  Resp: 18  Height: 5\' 1"  (1.549 m)  Weight: 66.225 kg (146 lb)  SpO2: 100%   Gen: well appearing, no distress Abd: soft, NT, ND GU: def to OR  A/P: R sided Mullerian gland cyst, for resection, pt aware R/B especially given close proximity to urethra, pt understands risks if urethra entered.  Plan OC post-op to decrease menstrual bleeding.   Ammarie Matsuura A. 08/04/2015 9:28 AM

## 2015-08-05 ENCOUNTER — Encounter (HOSPITAL_COMMUNITY): Payer: Self-pay | Admitting: Obstetrics

## 2015-10-10 ENCOUNTER — Ambulatory Visit: Payer: 59 | Admitting: Family Medicine

## 2019-08-21 NOTE — L&D Delivery Note (Signed)
Delivery Note At 10:16 PM a viable female was delivered via Vaginal, Spontaneous (Presentation:   Occiput Anterior).  APGAR: 8, 9; weight  .   Placenta status: Spontaneous, Intact.  Cord: 3 vessels with the following complications: None.  Cord pH: n/a  Anesthesia: Epidural Episiotomy: None Lacerations: 2nd degree;Sulcus Suture Repair: 2.0 3.0 vicryl rapide Est. Blood Loss (mL): 400  Mom to postpartum.  Baby to Couplet care / Skin to Skin.  Lendon Colonel 06/05/2020, 10:40 PM

## 2019-10-15 ENCOUNTER — Telehealth: Payer: Self-pay

## 2019-10-15 NOTE — Telephone Encounter (Signed)

## 2019-10-16 ENCOUNTER — Ambulatory Visit (INDEPENDENT_AMBULATORY_CARE_PROVIDER_SITE_OTHER): Payer: BLUE CROSS/BLUE SHIELD | Admitting: Internal Medicine

## 2019-10-16 ENCOUNTER — Other Ambulatory Visit: Payer: Self-pay

## 2019-10-16 ENCOUNTER — Encounter: Payer: Self-pay | Admitting: Internal Medicine

## 2019-10-16 VITALS — BP 114/75 | HR 65 | Temp 97.2°F | Resp 17 | Ht 62.5 in | Wt 153.0 lb

## 2019-10-16 DIAGNOSIS — N926 Irregular menstruation, unspecified: Secondary | ICD-10-CM

## 2019-10-16 DIAGNOSIS — Z7689 Persons encountering health services in other specified circumstances: Secondary | ICD-10-CM

## 2019-10-16 DIAGNOSIS — Z3A01 Less than 8 weeks gestation of pregnancy: Secondary | ICD-10-CM

## 2019-10-16 DIAGNOSIS — Z114 Encounter for screening for human immunodeficiency virus [HIV]: Secondary | ICD-10-CM | POA: Diagnosis not present

## 2019-10-16 LAB — POCT URINE PREGNANCY: Preg Test, Ur: POSITIVE — AB

## 2019-10-16 MED ORDER — DOXYLAMINE-PYRIDOXINE 10-10 MG PO TBEC: DELAYED_RELEASE_TABLET | ORAL | 1 refills | Status: DC

## 2019-10-16 NOTE — Patient Instructions (Addendum)
You are 7 weeks and 2 days pregnant. Your due date is Oct 13.   Nausea & Vomiting  Have saltine crackers or pretzels by your bed and eat a few bites before you raise your head out of bed in the morning  Eat small frequent meals throughout the day instead of large meals  Drink plenty of fluids throughout the day to stay hydrated, just don't drink a lot of fluids with your meals.  This can make your stomach fill up faster making you feel sick  Do not brush your teeth right after you eat  Products with real ginger are good for nausea, like ginger ale and ginger hard candy Make sure it says made with real ginger!  Sucking on sour candy like lemon heads is also good for nausea  If your prenatal vitamins make you nauseated, take them at night so you will sleep through the nausea  Sea Bands  If you feel like you need medicine for the nausea & vomiting please let us know  If you are unable to keep any fluids or food down please let us know

## 2019-10-16 NOTE — Progress Notes (Signed)
  Subjective:    Carlise Stofer - 24 y.o. female MRN [id number]  Date of birth: Jun 06, 1996  HPI  Zeynep Fantroy is to establish care. Patient has a PMH significant for labial cysts that were removed at [facility]. LMP 08/26/19, she has been tracking on an app so is sure of date. Never been pregnant before. No prior history of HTN or DM. She is experiencing constant nausea throughout the day, got worse this week. No actual vomiting. Also experiencing fatigue likely worsened by her work schedule.     ROS per HPI     Health Maintenance:  Health Maintenance Due  Topic Date Due  . HIV Screening  05/23/2009  . PAP-Cervical Cytology Screening  05/24/2015  . PAP SMEAR-Modifier  05/24/2015     Past Medical History: There are no problems to display for this patient.     Social History   reports that she has never smoked. She has never used smokeless tobacco. She reports that she does not drink alcohol or use drugs.   Family History  family history is not on file.   Medications: reviewed and updated   Objective:   Physical Exam BP 114/75   Pulse 65   Temp (!) 97.2 F (36.2 C) (Temporal)   Resp 17   Ht 5' 2.5" (1.588 m)   Wt 153 lb (69.4 kg)   LMP 08/26/2019 (Exact Date)   SpO2 98%   BMI 27.54 kg/m  Physical Exam  Constitutional: She is oriented to person, place, and time and well-developed, well-nourished, and in no distress. No distress.  Cardiovascular: Normal rate.  Pulmonary/Chest: Effort normal. No respiratory distress.  Musculoskeletal:        General: Normal range of motion.  Neurological: She is alert and oriented to person, place, and time.  Skin: Skin is warm and dry. She is not diaphoretic.  Psychiatric: Affect and judgment normal.        Assessment & Plan:    1. Encounter to establish care   2. Missed period Upreg positive.  - POCT urine pregnancy  3. Screening for HIV (human immunodeficiency virus) - HIV antibody (with reflex)  4. [redacted] weeks  gestation of pregnancy [redacted]w[redacted]d pregnant by LMP. Due date: 06/01/20. Diclegis ordered for nausea. Supportive care instructions given. Discussed she is due for a PAP but that can be done at her initial prenatal visit. Requests to be seen at [facility] again as has been a patient with them in the past.  - Ambulatory referral to Obstetrics / Gynecology - Doxylamine-Pyridoxine 10-10 MG TBEC; Initial: Two tablets at bedtime on day 1 and 2; if symptoms persist, take 1 tablet in morning and 2 tablets at bedtime on day 3; if symptoms persist, may increase to 1 tablet in morning, 1 tablet mid-afternoon, and 2 tablets at bedtime on day 4  Dispense: 60 tablet; Refill: 1      Marcy Siren, D.O. 10/16/2019, 9:24 AM Primary Care at [facility]

## 2019-10-17 LAB — HIV ANTIBODY (ROUTINE TESTING W REFLEX): HIV Screen 4th Generation wRfx: NONREACTIVE

## 2019-10-21 NOTE — Progress Notes (Signed)
Patient notified of results & recommendations. Expressed understanding.

## 2019-11-24 LAB — OB RESULTS CONSOLE ABO/RH: RH Type: POSITIVE

## 2019-11-24 LAB — OB RESULTS CONSOLE RUBELLA ANTIBODY, IGM: Rubella: IMMUNE

## 2019-11-24 LAB — OB RESULTS CONSOLE HEPATITIS B SURFACE ANTIGEN: Hepatitis B Surface Ag: NEGATIVE

## 2019-11-24 LAB — OB RESULTS CONSOLE HIV ANTIBODY (ROUTINE TESTING): HIV: NONREACTIVE

## 2019-11-24 LAB — OB RESULTS CONSOLE RPR: RPR: NONREACTIVE

## 2020-01-01 ENCOUNTER — Inpatient Hospital Stay (HOSPITAL_COMMUNITY)
Admission: AD | Admit: 2020-01-01 | Discharge: 2020-01-01 | Disposition: A | Discharge status: Home / Self Care | Payer: BLUE CROSS/BLUE SHIELD | Attending: Obstetrics and Gynecology | Admitting: Obstetrics and Gynecology

## 2020-01-01 ENCOUNTER — Ambulatory Visit
Admission: EM | Admit: 2020-01-01 | Discharge: 2020-01-01 | Disposition: A | Discharge status: Home / Self Care | Payer: 59

## 2020-01-01 ENCOUNTER — Other Ambulatory Visit: Payer: Self-pay

## 2020-01-01 ENCOUNTER — Encounter (HOSPITAL_COMMUNITY): Payer: Self-pay | Admitting: Obstetrics and Gynecology

## 2020-01-01 DIAGNOSIS — O99891 Other specified diseases and conditions complicating pregnancy: Secondary | ICD-10-CM | POA: Diagnosis not present

## 2020-01-01 DIAGNOSIS — Z041 Encounter for examination and observation following transport accident: Secondary | ICD-10-CM | POA: Insufficient documentation

## 2020-01-01 DIAGNOSIS — Z3A18 18 weeks gestation of pregnancy: Secondary | ICD-10-CM | POA: Insufficient documentation

## 2020-01-01 LAB — URINALYSIS, ROUTINE W REFLEX MICROSCOPIC
Bilirubin Urine: NEGATIVE
Glucose, UA: 50 mg/dL — AB
Hgb urine dipstick: NEGATIVE
Ketones, ur: NEGATIVE mg/dL
Nitrite: NEGATIVE
Protein, ur: NEGATIVE mg/dL
Specific Gravity, Urine: 1.016 (ref 1.005–1.030)
pH: 6 (ref 5.0–8.0)
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
## 2020-05-10 LAB — OB RESULTS CONSOLE GC/CHLAMYDIA
Chlamydia: NEGATIVE
Gonorrhea: NEGATIVE

## 2020-05-10 LAB — OB RESULTS CONSOLE GBS: GBS: NEGATIVE

## 2020-05-28 ENCOUNTER — Encounter (HOSPITAL_COMMUNITY): Payer: Self-pay | Admitting: Obstetrics and Gynecology

## 2020-05-28 ENCOUNTER — Other Ambulatory Visit: Payer: Self-pay

## 2020-05-28 ENCOUNTER — Inpatient Hospital Stay (HOSPITAL_COMMUNITY)
Admission: AD | Admit: 2020-05-28 | Discharge: 2020-05-28 | Disposition: A | Discharge status: Home / Self Care | Payer: BLUE CROSS/BLUE SHIELD | Attending: Obstetrics and Gynecology | Admitting: Obstetrics and Gynecology

## 2020-05-28 DIAGNOSIS — Z3A39 39 weeks gestation of pregnancy: Secondary | ICD-10-CM

## 2020-05-28 DIAGNOSIS — O471 False labor at or after 37 completed weeks of gestation: Secondary | ICD-10-CM | POA: Insufficient documentation

## 2020-05-28 DIAGNOSIS — O479 False labor, unspecified: Secondary | ICD-10-CM | POA: Diagnosis not present

## 2020-05-28 NOTE — MAU Note (Signed)
I have communicated with Judeth Horn NP and reviewed vital signs:  Vitals:   05/28/20 0459 05/28/20 0820  BP:  101/63  Pulse: 97 94  Resp: 20 18  Temp: 98.4 F (36.9 C)   SpO2:  99%    Vaginal exam:  Dilation: 4.5 Effacement (%): 90 Cervical Position: Middle Station: -2 Presentation: Vertex Exam by:: Janeth Rase RN,   Also reviewed contraction pattern and that non-stress test is reactive.  It has been documented that patient is contracting every 3-10 minutes with no cervical change over 3 hours not indicating active labor.  Patient denies any other complaints.  Based on this report provider has given order for discharge.  A discharge order and diagnosis entered by a provider.   Labor discharge instructions and fetal kick counts reviewed with patient.   Patient verbalized that she is hungry and ok with going home at this time.

## 2020-05-28 NOTE — MAU Provider Note (Signed)
S: Patient is here for RN labor evaluation. Strip, vital signs, & chart Reviewed   O:  Vitals:   05/28/20 0459 05/28/20 0820  BP:  101/63  Pulse: 97 94  Resp: 20 18  Temp: 98.4 F (36.9 C)   TempSrc: Oral   SpO2:  99%  Weight: 81.1 kg   Height: 5\' 2"  (1.575 m)    No results found for this or any previous visit (from the past 24 hour(s)).  Dilation: 4.5 Effacement (%): 90 Cervical Position: Middle Station: -2 Presentation: Vertex Exam by:: [email or web address] RN   FHR: 150 bpm, Mod Var, no Decels, 15x15 Accels UC: irregular   A: 1. False labor   2. [redacted] weeks gestation of pregnancy      P:  RN to discharge home in stable condition with return precautions & fetal kick counts  Janeth Rase FNP 8:25 AM

## 2020-05-28 NOTE — MAU Note (Signed)
Patient reports that her next OB appointment is on Tuesday at 1145am.

## 2020-05-28 NOTE — Discharge Instructions (Signed)
Fetal Movement Counts Patient Name: ________________________________________________ Patient Due Date: ____________________ What is a fetal movement count?  A fetal movement count is the number of times that you feel your baby move during a certain amount of time. This may also be called a fetal kick count. A fetal movement count is recommended for every pregnant woman. You may be asked to start counting fetal movements as early as week 28 of your pregnancy. Pay attention to when your baby is most active. You may notice your baby's sleep and wake cycles. You may also notice things that make your baby move more. You should do a fetal movement count:  When your baby is normally most active.  At the same time each day. A good time to count movements is while you are resting, after having something to eat and drink. How do I count fetal movements? 1. Find a quiet, comfortable area. Sit, or lie down on your side. 2. Write down the date, the start time and stop time, and the number of movements that you felt between those two times. Take this information with you to your health care visits. 3. Write down your start time when you feel the first movement. 4. Count kicks, flutters, swishes, rolls, and jabs. You should feel at least 10 movements. 5. You may stop counting after you have felt 10 movements, or if you have been counting for 2 hours. Write down the stop time. 6. If you do not feel 10 movements in 2 hours, contact your health care provider for further instructions. Your health care provider may want to do additional tests to assess your baby's well-being. Contact a health care provider if:  You feel fewer than 10 movements in 2 hours.  Your baby is not moving like he or she usually does. Date: ____________ Start time: ____________ Stop time: ____________ Movements: ____________ Date: ____________ Start time: ____________ Stop time: ____________ Movements: ____________ Date: ____________  Start time: ____________ Stop time: ____________ Movements: ____________ Date: ____________ Start time: ____________ Stop time: ____________ Movements: ____________ Date: ____________ Start time: ____________ Stop time: ____________ Movements: ____________ Date: ____________ Start time: ____________ Stop time: ____________ Movements: ____________ Date: ____________ Start time: ____________ Stop time: ____________ Movements: ____________ Date: ____________ Start time: ____________ Stop time: ____________ Movements: ____________ Date: ____________ Start time: ____________ Stop time: ____________ Movements: ____________ This information is not intended to replace advice given to you by your health care provider. Make sure you discuss any questions you have with your health care provider. Document Revised: 03/26/2019 Document Reviewed: 03/26/2019 Elsevier Patient Education  2020 [employer]        Signs and Symptoms of Labor Labor is your body's natural process of moving your baby, placenta, and umbilical cord out of your uterus. The process of labor usually starts when your baby is full-term, between 37 and 40 weeks of pregnancy. How will I know when I am close to going into labor? As your body prepares for labor and the birth of your baby, you may notice the following symptoms in the weeks and days before true labor starts:  Having a strong desire to get your home ready to receive your new baby. This is called nesting. Nesting may be a sign that labor is approaching, and it may occur several weeks before birth. Nesting may involve cleaning and organizing your home.  Passing a small amount of thick, bloody mucus out of your vagina (normal bloody show or losing your mucus plug). This may happen more than a   week before labor begins, or it might occur right before labor begins as the opening of the cervix starts to widen (dilate). For some women, the entire mucus plug passes at once. For others,  smaller portions of the mucus plug may gradually pass over several days.  Your baby moving (dropping) lower in your pelvis to get into position for birth (lightening). When this happens, you may feel more pressure on your bladder and pelvic bone and less pressure on your ribs. This may make it easier to breathe. It may also cause you to need to urinate more often and have problems with bowel movements.  Having "practice contractions" (Braxton Hicks contractions) that occur at irregular (unevenly spaced) intervals that are more than 10 minutes apart. This is also called false labor. False labor contractions are common after exercise or sexual activity, and they will stop if you change position, rest, or drink fluids. These contractions are usually mild and do not get stronger over time. They may feel like: ? A backache or back pain. ? Mild cramps, similar to menstrual cramps. ? Tightening or pressure in your abdomen. Other early symptoms that labor may be starting soon include:  Nausea or loss of appetite.  Diarrhea.  Having a sudden burst of energy, or feeling very tired.  Mood changes.  Having trouble sleeping. How will I know when labor has begun? Signs that true labor has begun may include:  Having contractions that come at regular (evenly spaced) intervals and increase in intensity. This may feel like more intense tightening or pressure in your abdomen that moves to your back. ? Contractions may also feel like rhythmic pain in your upper thighs or back that comes and goes at regular intervals. ? For first-time mothers, this change in intensity of contractions often occurs at a more gradual pace. ? Women who have given birth before may notice a more rapid progression of contraction changes.  Having a feeling of pressure in the vaginal area.  Your water breaking (rupture of membranes). This is when the sac of fluid that surrounds your baby breaks. When this happens, you will notice  fluid leaking from your vagina. This may be clear or blood-tinged. Labor usually starts within 24 hours of your water breaking, but it may take longer to begin. ? Some women notice this as a gush of fluid. ? Others notice that their underwear repeatedly becomes damp. Follow these instructions at home:   When labor starts, or if your water breaks, call your health care provider or nurse care line. Based on your situation, they will determine when you should go in for an exam.  When you are in early labor, you may be able to rest and manage symptoms at home. Some strategies to try at home include: ? Breathing and relaxation techniques. ? Taking a warm bath or shower. ? Listening to music. ? Using a heating pad on the lower back for pain. If you are directed to use heat:  Place a towel between your skin and the heat source.  Leave the heat on for 20-30 minutes.  Remove the heat if your skin turns bright red. This is especially important if you are unable to feel pain, heat, or cold. You may have a greater risk of getting burned. Get help right away if:  You have painful, regular contractions that are 5 minutes apart or less.  Labor starts before you are [redacted] weeks along in your pregnancy.  You have a fever.  You have   a headache that does not go away.  You have bright red blood coming from your vagina.  You do not feel your baby moving.  You have a sudden onset of: ? Severe headache with vision problems. ? Nausea, vomiting, or diarrhea. ? Chest pain or shortness of breath. These symptoms may be an emergency. If your health care provider recommends that you go to the hospital or birth center where you plan to deliver, do not drive yourself. Have someone else drive you, or call emergency services (911 in the [place] Summary  Labor is your body's natural process of moving your baby, placenta, and umbilical cord out of your uterus.  The process of labor usually starts when your baby is  full-term, between 37 and 40 weeks of pregnancy.  When labor starts, or if your water breaks, call your health care provider or nurse care line. Based on your situation, they will determine when you should go in for an exam. This information is not intended to replace advice given to you by your health care provider. Make sure you discuss any questions you have with your health care provider. Document Revised: 05/06/2017 Document Reviewed: 01/11/2017 Elsevier Patient Education  2020 [employer] Inc.  

## 2020-05-28 NOTE — MAU Note (Signed)
PT  SAYS [facility] STRONG SINCE 0330- [facility]- WITH DR [facility]- 4-5 CM .  DENIS HSV AND MRSA. GBS- NEG

## 2020-06-03 ENCOUNTER — Other Ambulatory Visit: Payer: Self-pay | Admitting: Obstetrics

## 2020-06-05 ENCOUNTER — Encounter (HOSPITAL_COMMUNITY): Payer: Self-pay | Admitting: Obstetrics

## 2020-06-05 ENCOUNTER — Inpatient Hospital Stay (HOSPITAL_COMMUNITY): Payer: BLUE CROSS/BLUE SHIELD | Admitting: Anesthesiology

## 2020-06-05 ENCOUNTER — Inpatient Hospital Stay (HOSPITAL_COMMUNITY)
Admission: AD | Admit: 2020-06-05 | Discharge: 2020-06-07 | DRG: 807 | Disposition: A | Discharge status: Home / Self Care | Payer: BLUE CROSS/BLUE SHIELD | Attending: Obstetrics | Admitting: Obstetrics

## 2020-06-05 ENCOUNTER — Other Ambulatory Visit: Payer: Self-pay

## 2020-06-05 DIAGNOSIS — Z3490 Encounter for supervision of normal pregnancy, unspecified, unspecified trimester: Secondary | ICD-10-CM

## 2020-06-05 DIAGNOSIS — O9902 Anemia complicating childbirth: Principal | ICD-10-CM | POA: Diagnosis present

## 2020-06-05 DIAGNOSIS — Z23 Encounter for immunization: Secondary | ICD-10-CM

## 2020-06-05 DIAGNOSIS — O26893 Other specified pregnancy related conditions, third trimester: Secondary | ICD-10-CM | POA: Diagnosis present

## 2020-06-05 DIAGNOSIS — Z3A40 40 weeks gestation of pregnancy: Secondary | ICD-10-CM | POA: Diagnosis not present

## 2020-06-05 DIAGNOSIS — J45909 Unspecified asthma, uncomplicated: Secondary | ICD-10-CM | POA: Diagnosis present

## 2020-06-05 DIAGNOSIS — Z20822 Contact with and (suspected) exposure to covid-19: Secondary | ICD-10-CM | POA: Diagnosis present

## 2020-06-05 DIAGNOSIS — D573 Sickle-cell trait: Secondary | ICD-10-CM | POA: Diagnosis present

## 2020-06-05 DIAGNOSIS — O9952 Diseases of the respiratory system complicating childbirth: Secondary | ICD-10-CM | POA: Diagnosis present

## 2020-06-05 LAB — TYPE AND SCREEN
ABO/RH(D): O POS
Antibody Screen: NEGATIVE

## 2020-06-05 LAB — CBC
HCT: 32.3 % — ABNORMAL LOW (ref 36.0–46.0)
Hemoglobin: 10.4 g/dL — ABNORMAL LOW (ref 12.0–15.0)
MCH: 25.2 pg — ABNORMAL LOW (ref 26.0–34.0)
MCHC: 32.2 g/dL (ref 30.0–36.0)
MCV: 78.2 fL — ABNORMAL LOW (ref 80.0–100.0)
Platelets: 273 10*3/uL (ref 150–400)
RBC: 4.13 MIL/uL (ref 3.87–5.11)
RDW: 15.1 % (ref 11.5–15.5)
WBC: 5.9 10*3/uL (ref 4.0–10.5)
nRBC: 0 % (ref 0.0–0.2)

## 2020-06-05 LAB — RESPIRATORY PANEL BY RT PCR (FLU A&B, COVID)
Influenza A by PCR: NEGATIVE
Influenza B by PCR: NEGATIVE
SARS Coronavirus 2 by RT PCR: NEGATIVE

## 2020-06-05 MED ORDER — PHENYLEPHRINE 40 MCG/ML (10ML) SYRINGE FOR IV PUSH (FOR BLOOD PRESSURE SUPPORT): 80.0000 ug | PREFILLED_SYRINGE | INTRAVENOUS | Status: DC | PRN

## 2020-06-05 MED ORDER — OXYTOCIN BOLUS FROM INFUSION
333.0000 mL | Freq: Once | INTRAVENOUS | Status: AC
  Administered 2020-06-05: 333 mL via INTRAVENOUS

## 2020-06-05 MED ORDER — LIDOCAINE HCL (PF) 1 % IJ SOLN
INTRAMUSCULAR | Status: DC | PRN
  Administered 2020-06-05: 8 mL via EPIDURAL

## 2020-06-05 MED ORDER — SODIUM CHLORIDE (PF) 0.9 % IJ SOLN
INTRAMUSCULAR | Status: DC | PRN
  Administered 2020-06-05: 12 mL/h via EPIDURAL

## 2020-06-05 MED ORDER — OXYTOCIN-SODIUM CHLORIDE 30-0.9 UT/500ML-% IV SOLN
1.0000 m[IU]/min | INTRAVENOUS | Status: DC
  Administered 2020-06-05: 2 m[IU]/min via INTRAVENOUS
  Filled 2020-06-05: qty 500

## 2020-06-05 MED ORDER — SOD CITRATE-CITRIC ACID 500-334 MG/5ML PO SOLN: 30.0000 mL | ORAL | Status: DC | PRN

## 2020-06-05 MED ORDER — LACTATED RINGERS IV SOLN: INTRAVENOUS | Status: DC

## 2020-06-05 MED ORDER — LIDOCAINE HCL (PF) 1 % IJ SOLN: 30.0000 mL | INTRAMUSCULAR | Status: DC | PRN

## 2020-06-05 MED ORDER — TERBUTALINE SULFATE 1 MG/ML IJ SOLN: 0.2500 mg | Freq: Once | INTRAMUSCULAR | Status: DC | PRN

## 2020-06-05 MED ORDER — OXYTOCIN-SODIUM CHLORIDE 30-0.9 UT/500ML-% IV SOLN: 2.5000 [IU]/h | INTRAVENOUS | Status: DC

## 2020-06-05 MED ORDER — OXYCODONE-ACETAMINOPHEN 5-325 MG PO TABS: 2.0000 | ORAL_TABLET | ORAL | Status: DC | PRN

## 2020-06-05 MED ORDER — ONDANSETRON HCL 4 MG/2ML IJ SOLN: 4.0000 mg | Freq: Four times a day (QID) | INTRAMUSCULAR | Status: DC | PRN

## 2020-06-05 MED ORDER — EPHEDRINE 5 MG/ML INJ: 10.0000 mg | INTRAVENOUS | Status: DC | PRN

## 2020-06-05 MED ORDER — LACTATED RINGERS IV SOLN: 500.0000 mL | Freq: Once | INTRAVENOUS | Status: DC

## 2020-06-05 MED ORDER — LACTATED RINGERS IV SOLN
500.0000 mL | INTRAVENOUS | Status: DC | PRN
  Administered 2020-06-05: 500 mL via INTRAVENOUS

## 2020-06-05 MED ORDER — DIPHENHYDRAMINE HCL 50 MG/ML IJ SOLN: 12.5000 mg | INTRAMUSCULAR | Status: DC | PRN

## 2020-06-05 MED ORDER — OXYCODONE-ACETAMINOPHEN 5-325 MG PO TABS: 1.0000 | ORAL_TABLET | ORAL | Status: DC | PRN

## 2020-06-05 MED ORDER — FLEET ENEMA 7-19 GM/118ML RE ENEM: 1.0000 | ENEMA | RECTAL | Status: DC | PRN

## 2020-06-05 MED ORDER — FENTANYL-BUPIVACAINE-NACL 0.5-0.125-0.9 MG/250ML-% EP SOLN
12.0000 mL/h | EPIDURAL | Status: DC | PRN
  Filled 2020-06-05: qty 250

## 2020-06-05 MED ORDER — ACETAMINOPHEN 325 MG PO TABS: 650.0000 mg | ORAL_TABLET | ORAL | Status: DC | PRN

## 2020-06-05 NOTE — Anesthesia Preprocedure Evaluation (Signed)
Anesthesia Evaluation  Patient identified by MRN, date of birth, ID band Patient awake    Reviewed: Allergy & Precautions, NPO status , Patient's Chart, lab work & pertinent test results  Airway Mallampati: II  TM Distance: >3 FB Neck ROM: Full    Dental no notable dental hx.    Pulmonary asthma ,    Pulmonary exam normal breath sounds clear to auscultation       Cardiovascular negative cardio ROS Normal cardiovascular exam Rhythm:Regular Rate:Normal     Neuro/Psych negative neurological ROS  negative psych ROS   GI/Hepatic negative GI ROS, Neg liver ROS,   Endo/Other  negative endocrine ROS  Renal/GU negative Renal ROS  negative genitourinary   Musculoskeletal negative musculoskeletal ROS (+)   Abdominal   Peds negative pediatric ROS (+)  Hematology  (+) anemia ,   Anesthesia Other Findings   Reproductive/Obstetrics (+) Pregnancy                             Anesthesia Physical Anesthesia Plan  ASA: II  Anesthesia Plan: Epidural   Post-op Pain Management:    Induction:   PONV Risk Score and Plan: 2 and Treatment may vary due to age or medical condition  Airway Management Planned: Natural Airway  Additional Equipment:   Intra-op Plan:   Post-operative Plan:   Informed Consent: I have reviewed the patients History and Physical, chart, labs and discussed the procedure including the risks, benefits and alternatives for the proposed anesthesia with the patient or authorized representative who has indicated his/her understanding and acceptance.       Plan Discussed with:   Anesthesia Plan Comments:         Anesthesia Quick Evaluation

## 2020-06-05 NOTE — Anesthesia Procedure Notes (Signed)
Epidural Patient location during procedure: OB Start time: 06/05/2020 5:23 PM End time: 06/05/2020 5:30 PM  Staffing Anesthesiologist: Mellody Dance, MD Performed: anesthesiologist   Preanesthetic Checklist Completed: patient identified, IV checked, site marked, risks and benefits discussed, monitors and equipment checked, pre-op evaluation and timeout performed  Epidural Patient position: sitting Prep: DuraPrep Patient monitoring: heart rate, cardiac monitor, continuous pulse ox and blood pressure Approach: midline Location: L3-L4 Injection technique: LOR saline  Needle:  Needle type: Tuohy  Needle gauge: 17 G Needle length: 9 cm Needle insertion depth: 6 cm Catheter size: 20 Guage Catheter at skin depth: 11 cm Test dose: negative and Other  Assessment Events: blood not aspirated, injection not painful, no injection resistance and negative IV test  Additional Notes Informed consent obtained prior to proceeding including risk of failure, 1% risk of PDPH, risk of minor discomfort and bruising.  Discussed rare but serious complications including epidural abscess, permanent nerve injury, epidural hematoma.  Discussed alternatives to epidural analgesia and patient desires to proceed.  Timeout performed pre-procedure verifying patient name, procedure, and platelet count.  Patient tolerated procedure well.

## 2020-06-05 NOTE — H&P (Signed)
Jessica Burnett is a 24 y.o. G1P0 at [redacted]w[redacted]d presenting for IOL. Pt notes intermittent contractions, now stronger and more consistent since starting pitocin . Good fetal movement, No vaginal bleeding, not leaking fluid.  P[facility]are at [facility] since 8 wks - Dated by 8 wk u/s, NCWD - Anemia -h/o asthma - SS carrier, FOB never tested - prelabor cervical dilation, started contracting and dilating around 36 wks - GERD   Prenatal Transfer Tool  Maternal Diabetes: No Genetic Screening: Normal Maternal Ultrasounds/Referrals: Normal Fetal Ultrasounds or other Referrals:  None Maternal Substance Abuse:  No Significant Maternal Medications:  None Significant Maternal Lab Results: Group B Strep negative     OB History    Gravida  1   Para      Term      Preterm      AB      Living        SAB      TAB      Ectopic      Multiple      Live Births             Past Medical History:  Diagnosis Date  . Asthma    Past Surgical History:  Procedure Laterality Date  . EXCISION VAGINAL CYST Right 08/04/2015   Procedure: EXCISION RIGHT VAGINAL Wall CYST;  Surgeon: Noland Fordyce, MD;  Location: [facility];  Service: Gynecology;  Laterality: Right;  . INCISION AND DRAINAGE ABSCESS Left 10/25/2014   Procedure: INCISION AND DRAINAGE ABSCESS/Removal of Left Skene's Gland Cyst;  Surgeon: Noland Fordyce, MD;  Location: [facility];  Service: Gynecology;  Laterality: Left;   Family History: family history is not on file. Social History:  reports that she has never smoked. She has never used smokeless tobacco. She reports previous alcohol use. She reports that she does not use drugs.  Review of Systems - Negative except intermittent contractions   Dilation: 4 Effacement (%): 100 Blood pressure (!) 91/54, pulse 97, temperature 98.4 F (36.9 C), temperature source Oral, resp. rate 16, height 5\' 2"  (1.575 m), weight 80.4 kg, last menstrual period 08/26/2019.   Repeat exam: 5/90/-1, AROM  clear  Physical Exam:  Gen: well appearing, no distress  Abd: gravid, NT, no RUQ pain LE: no edema, equal bilaterally, non-tender Toco: irreg q 3-6 FH: baseline 145s, accelerations present, no deceleratons, 10 beat variability  Prenatal labs: ABO, Rh: --/--/O POS (10/17 1340) Antibody: NEG (10/17 1340) Rubella:  immune RPR:   NR HBsAg:   neg HIV: Non Reactive (02/26 0935)  GBS:   neg 1 hr Glucola 106  Genetic screening nl quad Anatomy [birth year]1983 normal  CBC    Component Value Date/Time   WBC 5.9 06/05/2020 1340   RBC 4.13 06/05/2020 1340   HGB 10.4 (L) 06/05/2020 1340   HCT 32.3 (L) 06/05/2020 1340   PLT 273 06/05/2020 1340   MCV 78.2 (L) 06/05/2020 1340   MCH 25.2 (L) 06/05/2020 1340   MCHC 32.2 06/05/2020 1340   RDW 15.1 06/05/2020 1340   LYMPHSABS 1.3 08/04/2015 0825   MONOABS 0.4 08/04/2015 0825   EOSABS 0.2 08/04/2015 0825   BASOSABS 0.0 08/04/2015 0825     Assessment/Plan: 24 y.o. G1P0 at [redacted]w[redacted]d IOL, term,  GBS neg Reactive fetal testing   [redacted]w[redacted]d 06/05/2020, 3:41 PM

## 2020-06-06 LAB — RPR: RPR Ser Ql: NONREACTIVE

## 2020-06-06 MED ORDER — IBUPROFEN 600 MG PO TABS
600.0000 mg | ORAL_TABLET | Freq: Four times a day (QID) | ORAL | Status: DC
  Administered 2020-06-06 – 2020-06-07 (×5): 600 mg via ORAL
  Filled 2020-06-06 (×5): qty 1

## 2020-06-06 MED ORDER — OXYCODONE HCL 5 MG PO TABS: 10.0000 mg | ORAL_TABLET | ORAL | Status: DC | PRN

## 2020-06-06 MED ORDER — INFLUENZA VAC SPLIT QUAD 0.5 ML IM SUSY
0.5000 mL | PREFILLED_SYRINGE | INTRAMUSCULAR | Status: AC
  Administered 2020-06-07: 0.5 mL via INTRAMUSCULAR
  Filled 2020-06-06: qty 0.5

## 2020-06-06 MED ORDER — ONDANSETRON HCL 4 MG/2ML IJ SOLN: 4.0000 mg | INTRAMUSCULAR | Status: DC | PRN

## 2020-06-06 MED ORDER — ACETAMINOPHEN 325 MG PO TABS: 650.0000 mg | ORAL_TABLET | ORAL | Status: DC | PRN

## 2020-06-06 MED ORDER — ZOLPIDEM TARTRATE 5 MG PO TABS: 5.0000 mg | ORAL_TABLET | Freq: Every evening | ORAL | Status: DC | PRN

## 2020-06-06 MED ORDER — SENNOSIDES-DOCUSATE SODIUM 8.6-50 MG PO TABS
2.0000 | ORAL_TABLET | ORAL | Status: DC
  Administered 2020-06-06: 2 via ORAL
  Filled 2020-06-06: qty 2

## 2020-06-06 MED ORDER — COCONUT OIL OIL: 1.0000 "application " | TOPICAL_OIL | Status: DC | PRN

## 2020-06-06 MED ORDER — WITCH HAZEL-GLYCERIN EX PADS: 1.0000 "application " | MEDICATED_PAD | CUTANEOUS | Status: DC | PRN

## 2020-06-06 MED ORDER — ONDANSETRON HCL 4 MG PO TABS: 4.0000 mg | ORAL_TABLET | ORAL | Status: DC | PRN

## 2020-06-06 MED ORDER — BENZOCAINE-MENTHOL 20-0.5 % EX AERO
1.0000 "application " | INHALATION_SPRAY | CUTANEOUS | Status: DC | PRN
  Administered 2020-06-06: 1 via TOPICAL
  Filled 2020-06-06: qty 56

## 2020-06-06 MED ORDER — PRENATAL MULTIVITAMIN CH
1.0000 | ORAL_TABLET | Freq: Every day | ORAL | Status: DC
  Administered 2020-06-06: 1 via ORAL
  Filled 2020-06-06: qty 1

## 2020-06-06 MED ORDER — SIMETHICONE 80 MG PO CHEW: 80.0000 mg | CHEWABLE_TABLET | ORAL | Status: DC | PRN

## 2020-06-06 MED ORDER — OXYCODONE HCL 5 MG PO TABS: 5.0000 mg | ORAL_TABLET | ORAL | Status: DC | PRN

## 2020-06-06 MED ORDER — DIPHENHYDRAMINE HCL 25 MG PO CAPS: 25.0000 mg | ORAL_CAPSULE | Freq: Four times a day (QID) | ORAL | Status: DC | PRN

## 2020-06-06 MED ORDER — TETANUS-DIPHTH-ACELL PERTUSSIS 5-2.5-18.5 LF-MCG/0.5 IM SUSP: 0.5000 mL | Freq: Once | INTRAMUSCULAR | Status: DC

## 2020-06-06 MED ORDER — DIBUCAINE (PERIANAL) 1 % EX OINT: 1.0000 "application " | TOPICAL_OINTMENT | CUTANEOUS | Status: DC | PRN

## 2020-06-06 NOTE — Progress Notes (Signed)
Patient ID: Jessica Burnett, female   DOB: Jan 12, 1996, 24 y.o.   MRN: [id number]  PPD # 1 S/P NSVD  Live born female  Birth Weight: 7 lb 6.3 oz (3354 g) APGAR: 8, 9  Newborn Delivery   Birth date/time: 06/05/2020 22:16:00 Delivery type: Vaginal, Spontaneous     Baby name: Angus Palms Delivering provider: Noland Fordyce  Episiotomy:None   Lacerations:2nd degree;Sulcus   Feeding: breast  Pain control at delivery: Epidural   S:  Reports feeling well, mild soreness at laceration site             Tolerating po/ No nausea or vomiting             Bleeding is light             Pain controlled with acetaminophen and ibuprofen (OTC)             Up ad lib / ambulatory / voiding without difficulties   O:  A & O x 3, in no apparent distress              VS:  Vitals:   06/06/20 0001 06/06/20 0027 06/06/20 0130 06/06/20 0455  BP: 117/68 111/61 117/67 110/64  Pulse: 83 81 94 84  Resp: 18  18 16   Temp:  98.5 F (36.9 C) 98.4 F (36.9 C) 98.5 F (36.9 C)  TempSrc:  Oral Oral Oral  SpO2:  99% 100% 100%  Weight:      Height:        LABS:  Recent Labs    06/05/20 1340  WBC 5.9  HGB 10.4*  HCT 32.3*  PLT 273    Blood type: --/--/O POS (10/17 1340)  Rubella: Immune (04/06 0000)   I&O: I/O last 3 completed shifts: In: -  Out: 451 [Urine:51; Blood:400]          No intake/output data recorded.  Vaccines: TDaP          UTD         Flu             Prior to DC                    COVID-19 UTD  Gen: AAO x 3, NAD  Abdomen: soft, non-tender, non-distended             Fundus: firm, non-tender, U-1  Perineum: repair intact, no edema  Lochia: small  Extremities: no edema, no calf pain or tenderness    A/P: PPD # 1 24 y.o., G1P1001   Principal Problem:   Postpartum care following vaginal delivery 10/17 Active Problems:   SVD (spontaneous vaginal delivery)   Perineal laceration, second degree, sulcus   Doing well - stable status  Routine post partum orders  Anticipate discharge  tomorrow    11/17, MSN, CNM 06/06/2020, 9:24 AM

## 2020-06-06 NOTE — Anesthesia Postprocedure Evaluation (Signed)
Anesthesia Post Note  Patient: Jessica Burnett  Procedure(s) Performed: AN AD HOC LABOR EPIDURAL     Patient location during evaluation: Mother Baby Anesthesia Type: Epidural Level of consciousness: awake and alert and oriented Pain management: satisfactory to patient Vital Signs Assessment: post-procedure vital signs reviewed and stable Respiratory status: respiratory function stable Cardiovascular status: stable Postop Assessment: no headache, no backache, epidural receding, patient able to bend at knees, no signs of nausea or vomiting, adequate PO intake and able to ambulate Anesthetic complications: no   No complications documented.  Last Vitals:  Vitals:   06/06/20 0130 06/06/20 0455  BP: 117/67 110/64  Pulse: 94 84  Resp: 18 16  Temp: 36.9 C 36.9 C  SpO2: 100% 100%    Last Pain:  Vitals:   06/06/20 1019  TempSrc:   PainSc: 4    Pain Goal:                   [name]

## 2020-06-06 NOTE — Lactation Note (Signed)
This note was copied from a baby's chart. Lactation Consultation Note Baby 24 hrs old. Mom stated baby is now doing great. Mom stated baby just finished feeding for 1 hrs. 30 minutes each breast. Mom denies painful latches.  Mom stated baby slept most of day. She would attempt to feed and wake her up every couple of hours but baby wasn't interested in BF. Mom stated now she is interested.  Reminded of cluster feeding. Make sure baby has deep latch so mom will not have nipple trauma and baby will transfer milk.  Mom has flat nipples. Mom has shells. Encouraged to wear in am. Encouraged mom to get a nap and let FOB hold baby while she naps.  Mentioned to mom about engorgement and need to wear bra d/t breast getting heavy. Mom has a doula coming to her house 2 times after she gets home to assist in BF.  Encouraged mom to call for assistance or questions.  Patient Name: Jessica Burnett [id number] Date: 06/06/2020 Reason for consult: Follow-up assessment;Primapara;Term   Maternal Data    Feeding Feeding Type: Breast Fed  LATCH Score       Type of Nipple: Flat  Comfort (Breast/Nipple): Filling, red/small blisters or bruises, mild/mod discomfort (a little sore)  Hold (Positioning): No assistance needed to correctly position infant at breast.     Interventions Interventions: Skin to skin  Lactation Tools Discussed/Used Tools: Shells Shell Type: Inverted   Consult Status Consult Status: Follow-up Date: 06/07/20 Follow-up type: In-patient    Charyl Dancer 06/06/2020, 10:34 PM

## 2020-06-06 NOTE — Lactation Note (Signed)
This note was copied from a baby's chart. Lactation Consultation Note Baby is 5 hrs old. Mom holding baby in 2 blankets d/t low temp. Mom stated baby had just finished BF for 15 minutes. LC talking to mom, noted baby cueing, mom stated having trouble latching to Lt. Breast. LC assisted in latching to Lt. Breast in football hold. Placed dry wash rolled cloth under pendulous breast for support.  Placed baby STS and still covering the rest of baby to keep her warm. Newborn behavior, feeding habits, STS, I&O, supply and demand discussed. Mom encouraged to feed baby 8-12 times/24 hours and with feeding cues. Mom encouraged to waken baby for feeds if hasn't cued in 3 hrs. Noted mom getting sleeping as LC talking. LC swaddled baby laid in bassinet for mom to rest.  Lactation brochure given.  Encouraged to call for assistance or questions.   Patient Name: Jessica Burnett [id number] Date: 06/06/2020 Reason for consult: Initial assessment;Term;Primapara   Maternal Data Has patient been taught Hand Expression?: Yes Does the patient have breastfeeding experience prior to this delivery?: No  Feeding Feeding Type: Breast Fed  LATCH Score Latch: Grasps breast easily, tongue down, lips flanged, rhythmical sucking.  Audible Swallowing: None  Type of Nipple: Flat (semi flat, very short shaft)  Comfort (Breast/Nipple): Soft / non-tender  Hold (Positioning): Assistance needed to correctly position infant at breast and maintain latch.  LATCH Score: 6  Interventions Interventions: Breast feeding basics reviewed;Support pillows;Assisted with latch;Position options;Skin to skin;Breast massage;Shells;Pre-pump if needed;Breast compression;Adjust position  Lactation Tools Discussed/Used Tools: Shells Shell Type: Inverted WIC Program: No   Consult Status Consult Status: Follow-up Date: 06/06/20 (in pm) Follow-up type: In-patient    Charyl Dancer 06/06/2020, 4:14 AM

## 2020-06-07 MED ORDER — IBUPROFEN 600 MG PO TABS
600.0000 mg | ORAL_TABLET | Freq: Four times a day (QID) | ORAL | 0 refills | Status: DC
Start: 2020-06-07 — End: 2022-04-18

## 2020-06-07 MED ORDER — ACETAMINOPHEN 500 MG PO TABS: 1000.0000 mg | ORAL_TABLET | Freq: Four times a day (QID) | ORAL | 2 refills | Status: AC | PRN

## 2020-06-07 MED ORDER — COCONUT OIL OIL: 1.0000 "application " | TOPICAL_OIL | 0 refills | Status: AC | PRN

## 2020-06-07 MED ORDER — BENZOCAINE-MENTHOL 20-0.5 % EX AERO: 1.0000 "application " | INHALATION_SPRAY | CUTANEOUS | Status: AC | PRN

## 2020-06-07 NOTE — Lactation Note (Signed)
This note was copied from a baby's chart. Lactation Consultation Note  Patient Name: Jessica Burnett [id number] Date: 06/07/2020 Reason for consult: Follow-up assessment  P1 mother whose infant is now 67 hours old.  This is a term baby at 40+4 weeks.  Baby was swaddled and asleep when I arrived.  Mother gave some formula supplementation last night due to baby "not getting enough" and cluster feeding.  Offered to return when baby is ready to feed again to assist with latching.  Mother appreciative and will call when she is ready for my assistance.  Father present.   Maternal Data    Feeding Feeding Type: Bottle Fed - Formula  LATCH Score                   Interventions    Lactation Tools Discussed/Used     Consult Status Consult Status: Follow-up Date: 06/07/20 Follow-up type: In-patient    Symon Norwood R Mirl Hillery 06/07/2020, 8:12 AM

## 2020-06-07 NOTE — Discharge Summary (Signed)
OB Discharge Summary  Patient Name: Jessica Burnett DOB: 03-18-1996 MRN: [id number]  Date of admission: 06/05/2020 Delivering provider: Noland Fordyce   Admitting diagnosis: Pregnancy [Z34.90] Intrauterine pregnancy: [redacted]w[redacted]d     Secondary diagnosis: Patient Active Problem List   Diagnosis Date Noted  . Postpartum care following vaginal delivery 10/17 06/06/2020  . SVD (spontaneous vaginal delivery) 06/06/2020  . Perineal laceration, second degree, sulcus 06/06/2020   Additional problems:none   Date of discharge: 06/07/2020   Discharge diagnosis: Principal Problem:   Postpartum care following vaginal delivery 10/17 Active Problems:   SVD (spontaneous vaginal delivery)   Perineal laceration, second degree, sulcus                                                              Post partum procedures:flu vaccine  Augmentation: AROM and Pitocin Pain control: Epidural  Laceration:2nd degree;Sulcus  Episiotomy:None  Complications: None  Hospital course:  Induction of Labor With Vaginal Delivery   24 y.o. yo G1P1001 at [redacted]w[redacted]d was admitted to the hospital 06/05/2020 for induction of labor.  Indication for induction: Elective.  Patient had an uncomplicated labor course as follows: Membrane Rupture Time/Date: 3:48 PM ,06/05/2020   Delivery Method:Vaginal, Spontaneous  Episiotomy: None  Lacerations:  2nd degree;Sulcus  Details of delivery can be found in separate delivery note.  Patient had a routine postpartum course. Patient is discharged home 06/07/20.  Newborn Data: Birth date:06/05/2020  Birth time:10:16 PM  Gender:Female  Living status:Living  Apgars:8 ,9  Weight:3354 g   Physical exam  Vitals:   06/06/20 0455 06/06/20 1447 06/07/20 0008 06/07/20 0607  BP: 110/64 104/61 103/60 99/61  Pulse: 84 82 88 83  Resp: 16 16 18 18   Temp: 98.5 F (36.9 C)  98.6 F (37 C) 97.7 F (36.5 C)  TempSrc: Oral  Oral Oral  SpO2: 100% 100% 100% 100%  Weight:      Height:       General:  alert, cooperative and no distress Lochia: appropriate Uterine Fundus: firm Incision: N/A Perineum: repair intact, no edema DVT Evaluation: No cords or calf tenderness. No significant calf/ankle edema. Labs: Lab Results  Component Value Date   WBC 5.9 06/05/2020   HGB 10.4 (L) 06/05/2020   HCT 32.3 (L) 06/05/2020   MCV 78.2 (L) 06/05/2020   PLT 273 06/05/2020   No flowsheet data found. No flowsheet data found. Vaccines: TDaP          uptodate         Flu             Prior to d/c                    COVID-19 Up to date  Discharge instruction:  per After Visit Summary,  [facility] booklet and  "[facility]" hospital booklet  After Visit Meds:  Allergies as of 06/07/2020   No Known Allergies     Medication List    STOP taking these medications   Doxylamine-Pyridoxine 10-10 MG Tbec   folic acid 400 MCG tablet Commonly known as: FOLVITE   terconazole 0.4 % vaginal cream Commonly known as: TERAZOL 7     TAKE these medications   acetaminophen 500 MG tablet Commonly known as: TYLENOL Take 2 tablets (1,000  mg total) by mouth every 6 (six) hours as needed.   albuterol (2.5 MG/3ML) 0.083% nebulizer solution Commonly known as: PROVENTIL Take 2.5 mg by nebulization every 6 (six) hours as needed for wheezing or shortness of breath.   benzocaine-Menthol 20-0.5 % Aero Commonly known as: DERMOPLAST Apply 1 application topically as needed for irritation (perineal discomfort).   coconut oil Oil Apply 1 application topically as needed.   ibuprofen 600 MG tablet Commonly known as: ADVIL Take 1 tablet (600 mg total) by mouth every 6 (six) hours.   multivitamin tablet Take 1 tablet by mouth daily.   prenatal multivitamin Tabs tablet Take 1 tablet by mouth daily at 12 noon.            Discharge Care Instructions  (From admission, onward)         Start     Ordered   06/07/20 0000  Discharge wound care:       Comments: Sitz baths 2 times  /day with warm water x 1 week. May add herbals: 1 ounce dried comfrey leaf* 1 ounce calendula flowers 1 ounce lavender flowers  Supplies can be found online at [employer] sources at [employer], [employer]  1/2 ounce dried uva ursi leaves 1/2 ounce witch hazel blossoms (if you can find them) 1/2 ounce dried sage leaf 1/2 cup sea salt Directions: Bring 2 quarts of water to a boil. Turn off heat, and place 1 ounce (approximately 1 large handful) of the above mixed herbs (not the salt) into the pot. Steep, covered, for 30 minutes.  Strain the liquid well with a fine mesh strainer, and discard the herb material. Add 2 quarts of liquid to the tub, along with the 1/2 cup of salt. This medicinal liquid can also be made into compresses and peri-rinses.   06/07/20 0957          Diet: routine diet  Activity: Advance as tolerated. Pelvic rest for 6 weeks.   Postpartum contraception: Not Discussed  Newborn Data: Live born female  Birth Weight: 7 lb 6.3 oz (3354 g) APGAR: 8, 9  Newborn Delivery   Birth date/time: 06/05/2020 22:16:00 Delivery type: Vaginal, Spontaneous      named [place] Baby Feeding: Breast Disposition:home with mother   Delivery Report:  Review the Delivery Report for details.    Follow up:  Follow-up Information    Noland Fordyce, MD. Schedule an appointment as soon as possible for a visit in 6 week(s).   Specialty: Obstetrics and Gynecology Contact information: [address] [place] [address] [phone]                 Signed: Cipriano Mile, MSN 06/07/2020, 9:59 AM

## 2020-06-07 NOTE — Discharge Instructions (Signed)
Lactation outpatient support - home visit ° °Linda Coppola °RN, MHA, IBCLC °at [facility]: Lactation Consultant ° °[email or web address]/ ° ° ° °Additional resources: ° °[facility] °https://[facility].ca/information-sheets/ ° ° °Chiropractic specialist  ° °Dr. Leanna Hastings °[email or web address] ° °Craniosacral therapy for baby ° °Erin Balkind  °[email or web address] ° °

## 2020-06-13 ENCOUNTER — Ambulatory Visit
Admission: EM | Admit: 2020-06-13 | Discharge: 2020-06-13 | Disposition: A | Discharge status: Home / Self Care | Payer: BLUE CROSS/BLUE SHIELD | Attending: Physician Assistant | Admitting: Physician Assistant

## 2020-06-13 DIAGNOSIS — Z20822 Contact with and (suspected) exposure to covid-19: Secondary | ICD-10-CM | POA: Diagnosis present

## 2020-06-13 DIAGNOSIS — N3090 Cystitis, unspecified without hematuria: Secondary | ICD-10-CM | POA: Insufficient documentation

## 2020-06-13 LAB — POCT URINALYSIS DIP (MANUAL ENTRY)
Bilirubin, UA: NEGATIVE
Glucose, UA: NEGATIVE mg/dL
Ketones, POC UA: NEGATIVE mg/dL
Nitrite, UA: POSITIVE — AB
Protein Ur, POC: 30 mg/dL — AB
Spec Grav, UA: 1.01 (ref 1.010–1.025)
Urobilinogen, UA: 0.2 E.U./dL
pH, UA: 6 (ref 5.0–8.0)

## 2020-06-13 MED ORDER — CEPHALEXIN 500 MG PO CAPS: 500.0000 mg | ORAL_CAPSULE | Freq: Four times a day (QID) | ORAL | 0 refills | Status: DC

## 2020-06-13 MED ORDER — ACETAMINOPHEN 325 MG PO TABS
975.0000 mg | ORAL_TABLET | Freq: Once | ORAL | Status: AC
  Administered 2020-06-13: 975 mg via ORAL

## 2020-06-13 NOTE — ED Provider Notes (Signed)
[facility]    CSN: [id number] Arrival date & time: 06/13/20  1502      History   Chief Complaint Chief Complaint  Patient presents with  . Fever    HPI Jessica Burnett is a 24 y.o. female.   24 year old female with recent vaginal delivery 1 week ago comes in for fever that developed yesterday. Tmax 103.1 in office today. Has had headache, cough. Denies rhinorrhea, nasal congestion, sore throat. Also noted to have strong odor with cloudiness to urine. Urgency vs incontinence. Denies saddle anesthesia. Still with some abdominal pain that is related to postpartum symptoms, stable without changes. Denies nausea/vomiting. Vaginal bleeding from delivery. Had 2nd degree tear with stitches. No vaginal discharge. Denies changes to vaginal pain. Currently breastfeeding.      Past Medical History:  Diagnosis Date  . Asthma     Patient Active Problem List   Diagnosis Date Noted  . Postpartum care following vaginal delivery 10/17 06/06/2020  . SVD (spontaneous vaginal delivery) 06/06/2020  . Perineal laceration, second degree, sulcus 06/06/2020    Past Surgical History:  Procedure Laterality Date  . EXCISION VAGINAL CYST Right 08/04/2015   Procedure: EXCISION RIGHT VAGINAL Wall CYST;  Surgeon: Noland Fordyce, MD;  Location: [facility];  Service: Gynecology;  Laterality: Right;  . INCISION AND DRAINAGE ABSCESS Left 10/25/2014   Procedure: INCISION AND DRAINAGE ABSCESS/Removal of Left Skene's Gland Cyst;  Surgeon: Noland Fordyce, MD;  Location: [facility];  Service: Gynecology;  Laterality: Left;    OB History    Gravida  1   Para  1   Term  1   Preterm      AB      Living  1     SAB      TAB      Ectopic      Multiple  0   Live Births  1            Home Medications    Prior to Admission medications   Medication Sig Start Date End Date Taking? Authorizing Provider  acetaminophen (TYLENOL) 500 MG tablet Take 2 tablets (1,000 mg total) by mouth every 6  (six) hours as needed. 06/07/20 06/07/21  Neta Mends, CNM  albuterol (PROVENTIL) (2.5 MG/3ML) 0.083% nebulizer solution Take 2.5 mg by nebulization every 6 (six) hours as needed for wheezing or shortness of breath.    [provider]  benzocaine-Menthol (DERMOPLAST) 20-0.5 % AERO Apply 1 application topically as needed for irritation (perineal discomfort). 06/07/20   Neta Mends, CNM  cephALEXin (KEFLEX) 500 MG capsule Take 1 capsule (500 mg total) by mouth 4 (four) times daily. 06/13/20   Cathie Hoops, Caliana Spires V, PA-C  coconut oil OIL Apply 1 application topically as needed. 06/07/20   Neta Mends, CNM  ibuprofen (ADVIL) 600 MG tablet Take 1 tablet (600 mg total) by mouth every 6 (six) hours. 06/07/20   Neta Mends, CNM  Prenatal Vit-Fe Fumarate-FA (PRENATAL MULTIVITAMIN) TABS tablet Take 1 tablet by mouth daily at 12 noon.    [provider]    Family History History reviewed. No pertinent family history.  Social History Social History   Tobacco Use  . Smoking status: Never Smoker  . Smokeless tobacco: Never Used  Vaping Use  . Vaping Use: Never used  Substance Use Topics  . Alcohol use: Not Currently  . Drug use: No     Allergies   Patient has no known allergies.  Review of Systems Review of Systems  Reason unable to perform ROS: See HPI as above.     Physical Exam Triage Vital Signs ED Triage Vitals  Enc Vitals Group     BP 06/13/20 1608 112/72     Pulse Rate 06/13/20 1608 (!) 128     Resp 06/13/20 1608 18     Temp 06/13/20 1608 (!) 103.1 F (39.5 C)     Temp Source 06/13/20 1608 Oral     SpO2 06/13/20 1608 96 %     Weight --      Height --      Head Circumference --      Peak Flow --      Pain Score 06/13/20 1618 0     Pain Loc --      Pain Edu? --      Excl. in GC? --    No data found.  Updated Vital Signs BP 112/72 (BP Location: Left Arm)   Pulse (!) 128   Temp (!) 103.1 F (39.5 C) (Oral)   Resp 18   SpO2 96%    Breastfeeding Yes   Physical Exam Constitutional:      General: She is not in acute distress.    Appearance: She is well-developed. She is not ill-appearing, toxic-appearing or diaphoretic.  HENT:     Head: Normocephalic and atraumatic.  Eyes:     Conjunctiva/sclera: Conjunctivae normal.     Pupils: Pupils are equal, round, and reactive to light.  Cardiovascular:     Rate and Rhythm: Regular rhythm. Tachycardia present.  Pulmonary:     Effort: Pulmonary effort is normal. No respiratory distress.     Comments: LCTAB Abdominal:     General: Bowel sounds are normal.     Palpations: Abdomen is soft.     Tenderness: There is no abdominal tenderness. There is no right CVA tenderness, left CVA tenderness, guarding or rebound.  Musculoskeletal:     Cervical back: Normal range of motion and neck supple.  Skin:    General: Skin is warm and dry.  Neurological:     Mental Status: She is alert and oriented to person, place, and time.  Psychiatric:        Behavior: Behavior normal.        Judgment: Judgment normal.      UC Treatments / Results  Labs (all labs ordered are listed, but only abnormal results are displayed) Labs Reviewed  POCT URINALYSIS DIP (MANUAL ENTRY) - Abnormal; Notable for the following components:      Result Value   Clarity, UA cloudy (*)    Blood, UA large (*)    Protein Ur, POC =30 (*)    Nitrite, UA Positive (*)    Leukocytes, UA Large (3+) (*)    All other components within normal limits  NOVEL CORONAVIRUS, NAA  URINE CULTURE    EKG   Radiology No results found.  Procedures Procedures (including critical care time)  Medications Ordered in UC Medications  acetaminophen (TYLENOL) tablet 975 mg (975 mg Oral Given 06/13/20 1621)    Initial Impression / Assessment and Plan / UC Course  I have reviewed the triage vital signs and the nursing notes.  Pertinent labs & imaging results that were available during my care of the patient were reviewed by  me and considered in my medical decision making (see chart for details).    Patient febrile at 103, nontoxic in appearance. LCTAB. No abdominal pain. Dipstick positive for blood,  leuks, nitrites. Given with fever, will treat for pyelonephritis with keflex. However, given also with cough, discussed fever could be due to viral illness as well. COVID testing ordered. Continue tylenol/ibuprofen for fever. Push fluids. Return precautions given.   Final Clinical Impressions(s) / UC Diagnoses   Final diagnoses:  Encounter for screening laboratory testing for COVID-19 virus  Cystitis    ED Prescriptions    Medication Sig Dispense Auth. Provider   cephALEXin (KEFLEX) 500 MG capsule Take 1 capsule (500 mg total) by mouth 4 (four) times daily. 40 capsule Belinda Fisher, PA-C     PDMP not reviewed this encounter.   Belinda Fisher, PA-C 06/13/20 1744

## 2020-06-13 NOTE — Discharge Instructions (Signed)
Urine positive for urinary tract infection. Due to fever, I am treating you for a kidney infection. Start Keflex as directed. Keep hydrated, urine should be clear to pale yellow in color.   We are also sending for Covid testing, please quarantine until testing results return.  If you have increasing vaginal pain, puslike drainage, please follow-up with OB/GYN for further evaluation.  If having chest pain, shortness of breath, weakness, dizziness, go to the emergency department for further evaluation.

## 2020-06-13 NOTE — ED Triage Notes (Signed)
Pt states had a vaginal delivery on 10/17 and her flu shot on 10/19. Pt states developed a fever and chills yesterday. States developed a headache this am and now has a strong odor to urine. States spoke to her OBGYN today and they recommend to come here for a covid test and a UA. States had tylenol PM last night. States is breast feeding.

## 2020-06-14 LAB — NOVEL CORONAVIRUS, NAA: SARS-CoV-2, NAA: NOT DETECTED

## 2020-06-14 LAB — SARS-COV-2, NAA 2 DAY TAT

## 2020-06-16 LAB — URINE CULTURE: Culture: >=100000 — AB

## 2020-08-21 ENCOUNTER — Telehealth (HOSPITAL_COMMUNITY): Payer: Self-pay | Admitting: Lactation Services

## 2020-08-21 NOTE — Telephone Encounter (Signed)
Patient called the lactation line for advice. She has what seems to be a nipple bleb on each nipple. In the past, she has used epsom salts and warm/hot compresses that have allowed the blebs to resolve on their own. It has been about a week & there has been no improvement. Mom commented that her R nipple is also red & the R breast is also tender to the touch. Mom also has stabbing pain; pins & needle sensation after feeding. This occurs more in the R breast than the L breast.   I recommended that she call her OB and, if they agree she has nipple blebs, ask about the possible following treatments: using triamcinolone 0.1% cream on the blebs to hasten their resolution or see if they will unroof the blebs for her in the office.  I asked patient to apply warm compresses to her breasts when she is having the stabbing pain/pins & needle sensation to help distinguish if it is possibly a vasospasm reaction.   In case, the patient needs further advice about her breast concerns, I also gave her the phone # for the [facility] at [facility] (Dr. Hilario Quarry): [phone].  I encouraged patient to call us back if she has any more questions.  Glenetta Hew, RN, IBCLC

## 2022-04-18 ENCOUNTER — Ambulatory Visit: Admit: 2022-04-18 | Disposition: A | Discharge status: Other Acute Inpatient Hospital | Payer: BLUE CROSS/BLUE SHIELD

## 2022-04-18 ENCOUNTER — Ambulatory Visit
Admission: EM | Admit: 2022-04-18 | Discharge: 2022-04-18 | Disposition: A | Discharge status: Home / Self Care | Payer: BLUE CROSS/BLUE SHIELD | Attending: Physician Assistant | Admitting: Physician Assistant

## 2022-04-18 DIAGNOSIS — N898 Other specified noninflammatory disorders of vagina: Secondary | ICD-10-CM

## 2022-04-18 DIAGNOSIS — N750 Cyst of Bartholin's gland: Secondary | ICD-10-CM

## 2022-04-18 LAB — POCT URINE PREGNANCY: Preg Test, Ur: NEGATIVE

## 2022-04-18 MED ORDER — SULFAMETHOXAZOLE-TRIMETHOPRIM 800-160 MG PO TABS: 1.0000 | ORAL_TABLET | Freq: Two times a day (BID) | ORAL | 0 refills | Status: AC

## 2022-04-18 NOTE — ED Provider Notes (Signed)
[facility]    CSN: [id number] Arrival date & time: 04/18/22  1847      History   Chief Complaint Chief Complaint  Patient presents with   Abscess    HPI Jessica Burnett is a 26 y.o. female.   Patient presents today with a several day history of painful swelling in her right vagina.  She has a history of vaginal cysts and had to have these removed surgically in 2016.  She had not had any symptoms until recently but has had a pregnancy and was told that hormonal changes could cause these to recur.  She reports that pain is rated 3 on a 0-10 pain scale, described as aching, no aggravating relieving factors identified.  She denies any recent antibiotics.  She has not seen her OB/GYN about the symptoms but does intend to follow-up with them soon.  Denies any fever, nausea, vomiting.  She is breast-feeding her 69-year-old.  She does not believe she is pregnant but is unsure.  She has no specific concern for STI.    Past Medical History:  Diagnosis Date   Asthma     Patient Active Problem List   Diagnosis Date Noted   Postpartum care following vaginal delivery 10/17 06/06/2020   SVD (spontaneous vaginal delivery) 06/06/2020   Perineal laceration, second degree, sulcus 06/06/2020    Past Surgical History:  Procedure Laterality Date   EXCISION VAGINAL CYST Right 08/04/2015   Procedure: EXCISION RIGHT VAGINAL Wall CYST;  Surgeon: Noland Fordyce, MD;  Location: [facility];  Service: Gynecology;  Laterality: Right;   INCISION AND DRAINAGE ABSCESS Left 10/25/2014   Procedure: INCISION AND DRAINAGE ABSCESS/Removal of Left Skene's Gland Cyst;  Surgeon: Noland Fordyce, MD;  Location: [facility];  Service: Gynecology;  Laterality: Left;    OB History     Gravida  1   Para  1   Term  1   Preterm      AB      Living  1      SAB      IAB      Ectopic      Multiple  0   Live Births  1            Home Medications    Prior to Admission medications   Medication  Sig Start Date End Date Taking? Authorizing Provider  sulfamethoxazole-trimethoprim (BACTRIM DS) 800-160 MG tablet Take 1 tablet by mouth 2 (two) times daily for 7 days. 04/18/22 04/25/22 Yes Shyanne Mcclary K, PA-C  albuterol (PROVENTIL) (2.5 MG/3ML) 0.083% nebulizer solution Take 2.5 mg by nebulization every 6 (six) hours as needed for wheezing or shortness of breath.    [provider]  benzocaine-Menthol (DERMOPLAST) 20-0.5 % AERO Apply 1 application topically as needed for irritation (perineal discomfort). 06/07/20   Neta Mends, CNM  coconut oil OIL Apply 1 application topically as needed. 06/07/20   Neta Mends, CNM  Prenatal Vit-Fe Fumarate-FA (PRENATAL MULTIVITAMIN) TABS tablet Take 1 tablet by mouth daily at 12 noon.    [provider]    Family History History reviewed. No pertinent family history.  Social History Social History   Tobacco Use   Smoking status: Never   Smokeless tobacco: Never  Vaping Use   Vaping Use: Never used  Substance Use Topics   Alcohol use: Not Currently   Drug use: No     Allergies   Patient has no known allergies.   Review of Systems Review of  Systems  Constitutional:  Negative for activity change, appetite change, fatigue and fever.  Respiratory:  Negative for cough and shortness of breath.   Cardiovascular:  Negative for chest pain.  Gastrointestinal:  Negative for abdominal pain, diarrhea, nausea and vomiting.  Genitourinary:  Positive for vaginal discharge and vaginal pain. Negative for dysuria, frequency, pelvic pain, urgency and vaginal bleeding.     Physical Exam Triage Vital Signs ED Triage Vitals  Enc Vitals Group     BP 04/18/22 1907 110/68     Pulse Rate 04/18/22 1907 63     Resp 04/18/22 1907 16     Temp 04/18/22 1907 98.4 F (36.9 C)     Temp Source 04/18/22 1907 Oral     SpO2 04/18/22 1907 98 %     Weight --      Height --      Head Circumference --      Peak Flow --      Pain Score 04/18/22  1908 3     Pain Loc --      Pain Edu? --      Excl. in GC? --    No data found.  Updated Vital Signs BP 110/68 (BP Location: Right Arm)   Pulse 63   Temp 98.4 F (36.9 C) (Oral)   Resp 16   SpO2 98%   Breastfeeding Yes   Visual Acuity Right Eye Distance:   Left Eye Distance:   Bilateral Distance:    Right Eye Near:   Left Eye Near:    Bilateral Near:     Physical Exam Vitals reviewed.  Constitutional:      General: She is awake. She is not in acute distress.    Appearance: Normal appearance. She is well-developed. She is not ill-appearing.     Comments: Very pleasant female appears stated age in no acute distress sitting comfortably in exam room  HENT:     Head: Normocephalic and atraumatic.  Cardiovascular:     Rate and Rhythm: Normal rate and regular rhythm.     Heart sounds: Normal heart sounds, S1 normal and S2 normal. No murmur heard. Pulmonary:     Effort: Pulmonary effort is normal.     Breath sounds: Normal breath sounds. No wheezing, rhonchi or rales.     Comments: Clear to auscultation bilaterally Abdominal:     General: Bowel sounds are normal.     Palpations: Abdomen is soft.     Tenderness: There is no abdominal tenderness. There is no right CVA tenderness, left CVA tenderness, guarding or rebound.     Comments: Benign abdominal exam  Genitourinary:    Labia:        Right: No rash or tenderness.        Left: No rash or tenderness.      Vagina: Vaginal discharge present. No tenderness.     Cervix: Normal.     Uterus: Normal.      Adnexa: Right adnexa normal and left adnexa normal.     Comments: Swelling in the area of Bartholin cyst noted on the right vagina.  No active bleeding or drainage noted.  Thick white discharge noted in posterior vaginal vault.  No adnexal tenderness on exam. Psychiatric:        Behavior: Behavior is cooperative.      UC Treatments / Results  Labs (all labs ordered are listed, but only abnormal results are  displayed) Labs Reviewed  POCT URINE PREGNANCY  CERVICOVAGINAL ANCILLARY ONLY  EKG   Radiology No results found.  Procedures Procedures (including critical care time)  Medications Ordered in UC Medications - No data to display  Initial Impression / Assessment and Plan / UC Course  I have reviewed the triage vital signs and the nursing notes.  Pertinent labs & imaging results that were available during my care of the patient were reviewed by me and considered in my medical decision making (see chart for details).     Patient is well-appearing, afebrile, nontoxic, nontachycardic.  I am concerned for a Bartholin cyst.  This appears early and is not amenable to drainage in clinic today.  Swab to monitor for bacterial vaginosis and yeast was obtained today-results pending.  We will determine if additional treatment is necessary based on these results.  Discussed conservative measures including sitz bath.  Started Bactrim DS for additional symptom relief.  If she has any oral lesions or rash she is to stop the medication to be seen immediately.  Discussed that this does pass through breastmilk but is generally considered safe in a 81-year-old.  She is to monitor child for abdominal upset, nausea, vomiting, jaundice, scleral icterus.  She can use over-the-counter medications for pain relief.  Recommended close follow-up with her primary care/OB/GYN.  She is to see them soon as possible.  Discussed that if anything worsens she has enlarging lesion, increased pain, fever, nausea, vomiting she needs to be seen immediately.  Strict return precautions given to which she expressed understanding.  Final Clinical Impressions(s) / UC Diagnoses   Final diagnoses:  Bartholin cyst  Vaginal discharge     Discharge Instructions      It appears you have a Bartholin cyst.  I recommend that you use sitz bath's (few inches of warm water) several times per day to help manage symptoms.  Start Bactrim DS.   This is passed through breastmilk so monitor baby for irritability, nausea, vomiting, diarrhea, yellowing of skin/eyes.  Follow-up with your OB/GYN as soon as possible.  If you have any worsening symptoms including worsening pain, fever, nausea, vomiting you need to be seen immediately.     ED Prescriptions     Medication Sig Dispense Auth. Provider   sulfamethoxazole-trimethoprim (BACTRIM DS) 800-160 MG tablet Take 1 tablet by mouth 2 (two) times daily for 7 days. 14 tablet Braylyn Eye, Noberto Retort, PA-C      PDMP not reviewed this encounter.   Jeani Hawking, PA-C 04/18/22 2009

## 2022-04-18 NOTE — ED Triage Notes (Signed)
Pt c/o possible vaginal cyst. States she feels a pressure in her vagina and has had cysts hx removed by ob/gyn. Onset ~ Monday   Currently breastfeeding

## 2022-04-18 NOTE — Discharge Instructions (Signed)
It appears you have a Bartholin cyst.  I recommend that you use sitz bath's (few inches of warm water) several times per day to help manage symptoms.  Start Bactrim DS.  This is passed through breastmilk so monitor baby for irritability, nausea, vomiting, diarrhea, yellowing of skin/eyes.  Follow-up with your OB/GYN as soon as possible.  If you have any worsening symptoms including worsening pain, fever, nausea, vomiting you need to be seen immediately.

## 2022-04-20 ENCOUNTER — Telehealth (HOSPITAL_COMMUNITY): Payer: Self-pay | Admitting: Emergency Medicine

## 2022-04-20 LAB — CERVICOVAGINAL ANCILLARY ONLY
Bacterial Vaginitis (gardnerella): POSITIVE — AB
Candida Glabrata: NEGATIVE
Candida Vaginitis: NEGATIVE
Comment: NEGATIVE
Comment: NEGATIVE
Comment: NEGATIVE

## 2022-04-20 MED ORDER — METRONIDAZOLE 500 MG PO TABS: 500.0000 mg | ORAL_TABLET | Freq: Two times a day (BID) | ORAL | 0 refills | Status: DC

## 2023-02-10 ENCOUNTER — Encounter: Payer: Self-pay | Admitting: Emergency Medicine

## 2023-02-10 ENCOUNTER — Ambulatory Visit
Admission: EM | Admit: 2023-02-10 | Discharge: 2023-02-10 | Disposition: A | Discharge status: Home / Self Care | Payer: 59 | Attending: Internal Medicine | Admitting: Internal Medicine

## 2023-02-10 ENCOUNTER — Ambulatory Visit: Payer: Self-pay

## 2023-02-10 DIAGNOSIS — N3001 Acute cystitis with hematuria: Secondary | ICD-10-CM | POA: Insufficient documentation

## 2023-02-10 LAB — POCT URINALYSIS DIP (MANUAL ENTRY)
Bilirubin, UA: NEGATIVE
Glucose, UA: NEGATIVE mg/dL
Ketones, POC UA: NEGATIVE mg/dL
Nitrite, UA: NEGATIVE
Protein Ur, POC: NEGATIVE mg/dL
Spec Grav, UA: 1.02 (ref 1.010–1.025)
Urobilinogen, UA: 0.2 U/dL
pH, UA: 5.5 (ref 5.0–8.0)

## 2023-02-10 LAB — POCT URINE PREGNANCY: Preg Test, Ur: NEGATIVE

## 2023-02-10 MED ORDER — CEPHALEXIN 500 MG PO CAPS: 500.0000 mg | ORAL_CAPSULE | Freq: Two times a day (BID) | ORAL | 0 refills | Status: DC

## 2023-02-10 NOTE — Discharge Instructions (Signed)
Please start Keflex to address an urinary tract infection. Make sure you hydrate very well with plain water and a quantity of 80 ounces of water a day.  Please limit drinks that are considered urinary irritants such as soda, sweet tea, coffee, energy drinks, alcohol.  These can worsen your urinary and genital symptoms but also be the source of them.  I will let you know about your urine culture results through MyChart to see if we need to prescribe or change your antibiotics based off of those results.  

## 2023-02-10 NOTE — ED Provider Notes (Signed)
[facility]  Note:  This document was prepared using Conservation officer, historic buildings and may include unintentional dictation errors.  MRN: [id number] DOB: Dec 20, 1995  Subjective:   Jessica Burnett is a 27 y.o. female presenting for 1 week history of dysuria, urinary frequency and urgency. Has been traveling out of state and could not get seen. Has not taken any otc medications. Denies fever, n/v, abdominal pain, pelvic pain, rashes, vaginal discharge.  Has history of recurrent UTIs. Has been hydrating better and avoiding doing sodas daily. Also had Bartholin cysts but has had those repaired surgically and that also helped. Can have UTIs associated with sex. LMP was 02/05/2023. Took Plan B this past week. Patient is requesting an UPT.   No current facility-administered medications for this encounter.  Current Outpatient Medications:    albuterol (PROVENTIL) (2.5 MG/3ML) 0.083% nebulizer solution, Take 2.5 mg by nebulization every 6 (six) hours as needed for wheezing or shortness of breath., Disp: , Rfl:    benzocaine-Menthol (DERMOPLAST) 20-0.5 % AERO, Apply 1 application topically as needed for irritation (perineal discomfort)., Disp: , Rfl:    coconut oil OIL, Apply 1 application topically as needed., Disp: , Rfl: 0   metroNIDAZOLE (FLAGYL) 500 MG tablet, Take 1 tablet (500 mg total) by mouth 2 (two) times daily., Disp: 14 tablet, Rfl: 0   Prenatal Vit-Fe Fumarate-FA (PRENATAL MULTIVITAMIN) TABS tablet, Take 1 tablet by mouth daily at 12 noon., Disp: , Rfl:    No Known Allergies  Past Medical History:  Diagnosis Date   Asthma      Past Surgical History:  Procedure Laterality Date   EXCISION VAGINAL CYST Right 08/04/2015   Procedure: EXCISION RIGHT VAGINAL Wall CYST;  Surgeon: Noland Fordyce, MD;  Location: [facility];  Service: Gynecology;  Laterality: Right;   INCISION AND DRAINAGE ABSCESS Left 10/25/2014   Procedure: INCISION AND DRAINAGE ABSCESS/Removal of Left  Skene's Gland Cyst;  Surgeon: Noland Fordyce, MD;  Location: [facility];  Service: Gynecology;  Laterality: Left;    History reviewed. No pertinent family history.  Social History   Tobacco Use   Smoking status: Never   Smokeless tobacco: Never  Vaping Use   Vaping Use: Never used  Substance Use Topics   Alcohol use: Not Currently   Drug use: No    ROS   Objective:   Vitals: BP 106/68 (BP Location: Right Arm)   Pulse 76   Temp 98.2 F (36.8 C) (Oral)   Resp 17   SpO2 97%   Physical Exam Constitutional:      General: She is not in acute distress.    Appearance: Normal appearance. She is well-developed. She is not ill-appearing, toxic-appearing or diaphoretic.  HENT:     Head: Normocephalic and atraumatic.     Nose: Nose normal.     Mouth/Throat:     Mouth: Mucous membranes are moist.  Eyes:     General: No scleral icterus.       Right eye: No discharge.        Left eye: No discharge.     Extraocular Movements: Extraocular movements intact.     Conjunctiva/sclera: Conjunctivae normal.  Cardiovascular:     Rate and Rhythm: Normal rate.  Pulmonary:     Effort: Pulmonary effort is normal.  Abdominal:     General: Bowel sounds are normal. There is no distension.     Palpations: Abdomen is soft. There is no mass.     Tenderness: There is no  abdominal tenderness. There is no right CVA tenderness, left CVA tenderness, guarding or rebound.  Skin:    General: Skin is warm and dry.  Neurological:     General: No focal deficit present.     Mental Status: She is alert and oriented to person, place, and time.  Psychiatric:        Mood and Affect: Mood normal.        Behavior: Behavior normal.        Thought Content: Thought content normal.        Judgment: Judgment normal.     Results for orders placed or performed during the hospital encounter of 02/10/23 (from the past 24 hour(s))  POCT urinalysis dipstick     Status: Abnormal   Collection Time: 02/10/23  2:34 PM   Result Value Ref Range   Color, UA yellow yellow   Clarity, UA cloudy (A) clear   Glucose, UA negative negative mg/dL   Bilirubin, UA negative negative   Ketones, POC UA negative negative mg/dL   Spec Grav, UA 4.696 2.952 - 1.025   Blood, UA trace-intact (A) negative   pH, UA 5.5 5.0 - 8.0   Protein Ur, POC negative negative mg/dL   Urobilinogen, UA 0.2 0.2 or 1.0 E.U./dL   Nitrite, UA Negative Negative   Leukocytes, UA Small (1+) (A) Negative    Assessment and Plan :   PDMP not reviewed this encounter.  1. Acute cystitis with hematuria    Start Keflex to cover for acute cystitis, urine culture pending.  Recommended aggressive hydration, limiting urinary irritants. Counseled patient on potential for adverse effects with medications prescribed/recommended today, ER and return-to-clinic precautions discussed, patient verbalized understanding.    Wallis Bamberg, [place] 02/10/23 [phone]

## 2023-02-10 NOTE — ED Triage Notes (Signed)
Pt reports urinary frequency, discomfort at the end of urinating, urine odor.  Symptoms started on last Sunday but has been intermittent. Has not tried any OTC medications.

## 2023-02-12 LAB — URINE CULTURE: Culture: >=100000 — AB

## 2023-06-19 ENCOUNTER — Other Ambulatory Visit: Payer: Self-pay

## 2023-06-19 DIAGNOSIS — W230XXA Caught, crushed, jammed, or pinched between moving objects, initial encounter: Secondary | ICD-10-CM | POA: Insufficient documentation

## 2023-06-19 DIAGNOSIS — S6991XA Unspecified injury of right wrist, hand and finger(s), initial encounter: Secondary | ICD-10-CM | POA: Diagnosis present

## 2023-06-19 DIAGNOSIS — S60111A Contusion of right thumb with damage to nail, initial encounter: Secondary | ICD-10-CM | POA: Diagnosis not present

## 2023-06-20 ENCOUNTER — Emergency Department (HOSPITAL_BASED_OUTPATIENT_CLINIC_OR_DEPARTMENT_OTHER): Payer: 59

## 2023-06-20 ENCOUNTER — Encounter (HOSPITAL_BASED_OUTPATIENT_CLINIC_OR_DEPARTMENT_OTHER): Payer: Self-pay

## 2023-06-20 ENCOUNTER — Emergency Department (HOSPITAL_BASED_OUTPATIENT_CLINIC_OR_DEPARTMENT_OTHER)
Admission: EM | Admit: 2023-06-20 | Discharge: 2023-06-20 | Disposition: A | Discharge status: Home / Self Care | Payer: 59 | Attending: Emergency Medicine | Admitting: Emergency Medicine

## 2023-06-20 ENCOUNTER — Other Ambulatory Visit: Payer: Self-pay

## 2023-06-20 DIAGNOSIS — S60011A Contusion of right thumb without damage to nail, initial encounter: Secondary | ICD-10-CM

## 2023-06-20 DIAGNOSIS — S6010XA Contusion of unspecified finger with damage to nail, initial encounter: Secondary | ICD-10-CM

## 2023-06-20 MED ORDER — ACETAMINOPHEN 325 MG PO TABS
650.0000 mg | ORAL_TABLET | Freq: Once | ORAL | Status: AC
  Administered 2023-06-20: 650 mg via ORAL
  Filled 2023-06-20: qty 2

## 2023-06-20 NOTE — ED Triage Notes (Signed)
Pt reports she slammed her right thumb in the car door tonight around [employer]. She presents with bruising to the nailbed of her right thumb and she is reporting throbbing pain into her wrist that radiates up into her arm. She able to wiggle her thumb, + sensation but states she is unable to bend her thumb.

## 2023-06-20 NOTE — ED Notes (Signed)
Pt slammed her rt. Thumb in her car door last night.   C/o pain in her hand and wrist that radiates up her arm.

## 2023-06-20 NOTE — ED Provider Notes (Signed)
[facility] Provider Note   CSN: [id number] Arrival date & time: 06/19/23  2357     History  Chief Complaint  Patient presents with   Hand Pain    Jessica Burnett is a 27 y.o. female.  Patient reports slamming her right thumb in her car door around 7 PM.  Complains of pain to the entire thumb that radiates up her wrist and forearm.  Black discoloration of her right thumb nail.  Reduced range of motion at IP and MCP joint.  States only part of the thumb that got in the door was the distal phalanx.  This is a throbbing constant pain but occasionally she has shooting pain that radiates up her wrist and arm.  Reduced range of motion secondary to pain.  No weakness, numbness or tingling.  No fever.  No chest pain or shortness of breath.  The history is provided by the patient.  Hand Pain Pertinent negatives include no chest pain, no abdominal pain, no headaches and no shortness of breath.       Home Medications Prior to Admission medications   Medication Sig Start Date End Date Taking? Authorizing Provider  albuterol (PROVENTIL) (2.5 MG/3ML) 0.083% nebulizer solution Take 2.5 mg by nebulization every 6 (six) hours as needed for wheezing or shortness of breath.    [provider]  benzocaine-Menthol (DERMOPLAST) 20-0.5 % AERO Apply 1 application topically as needed for irritation (perineal discomfort). 06/07/20   Neta Mends, CNM  cephALEXin (KEFLEX) 500 MG capsule Take 1 capsule (500 mg total) by mouth 2 (two) times daily. 02/10/23   Wallis Bamberg, PA-C  coconut oil OIL Apply 1 application topically as needed. 06/07/20   Neta Mends, CNM  metroNIDAZOLE (FLAGYL) 500 MG tablet Take 1 tablet (500 mg total) by mouth 2 (two) times daily. 04/20/22   Lamptey, Britta Mccreedy, MD  Prenatal Vit-Fe Fumarate-FA (PRENATAL MULTIVITAMIN) TABS tablet Take 1 tablet by mouth daily at 12 noon.    [provider]      Allergies    Patient has no  known allergies.    Review of Systems   Review of Systems  Constitutional:  Negative for activity change, appetite change and fever.  HENT:  Negative for congestion and rhinorrhea.   Respiratory:  Negative for cough, chest tightness and shortness of breath.   Cardiovascular:  Negative for chest pain.  Gastrointestinal:  Negative for abdominal pain, nausea and vomiting.  Genitourinary:  Negative for dysuria and hematuria.  Musculoskeletal:  Positive for arthralgias and myalgias.  Skin:  Positive for wound.  Neurological:  Negative for dizziness, weakness and headaches.   all other systems are negative except as noted in the HPI and PMH.    Physical Exam Updated Vital Signs BP 117/79   Pulse 60   Temp 98.2 F (36.8 C)   Resp 18   Ht 5\' 2"  (1.575 m)   Wt 72.6 kg   LMP 05/18/2023 (Exact Date)   SpO2 100%   BMI 29.26 kg/m  Physical Exam Vitals and nursing note reviewed.  Constitutional:      General: She is not in acute distress.    Appearance: She is well-developed.  HENT:     Head: Normocephalic and atraumatic.     Mouth/Throat:     Pharynx: No oropharyngeal exudate.  Eyes:     Conjunctiva/sclera: Conjunctivae normal.     Pupils: Pupils are equal, round, and reactive to light.  Neck:  Comments: No meningismus. Cardiovascular:     Rate and Rhythm: Normal rate and regular rhythm.     Heart sounds: Normal heart sounds. No murmur heard. Pulmonary:     Effort: Pulmonary effort is normal. No respiratory distress.     Breath sounds: Normal breath sounds.  Abdominal:     Palpations: Abdomen is soft.     Tenderness: There is no abdominal tenderness. There is no guarding or rebound.  Musculoskeletal:        General: Swelling and tenderness present.     Cervical back: Normal range of motion and neck supple.     Comments: Subungual hematoma proximal one third of right thumbnail. Tenderness to palpation diffusely with reduced range of motion at IP and MCP joint. Full  motion of wrist.  Intact radial pulse and cardinal hand movements.  Skin:    General: Skin is warm.     Findings: Bruising present.  Neurological:     Mental Status: She is alert and oriented to person, place, and time.     Cranial Nerves: No cranial nerve deficit.     Motor: No abnormal muscle tone.     Coordination: Coordination normal.     Comments:  5/5 strength throughout. CN 2-12 intact.Equal grip strength.   Psychiatric:        Behavior: Behavior normal.     ED Results / Procedures / Treatments   Labs (all labs ordered are listed, but only abnormal results are displayed) Labs Reviewed - No data to display  EKG None  Radiology DG Wrist Complete Right  Result Date: 06/20/2023 CLINICAL DATA:  Injury EXAM: RIGHT WRIST - COMPLETE 3+ VIEW COMPARISON:  None Available. FINDINGS: There is no evidence of fracture or dislocation. There is no evidence of arthropathy or other focal bone abnormality. Soft tissues are unremarkable. IMPRESSION: Negative. Electronically Signed   By: Jasmine Pang M.D.   On: 06/20/2023 02:52   DG Hand Complete Right  Result Date: 06/20/2023 CLINICAL DATA:  Injury EXAM: RIGHT HAND - COMPLETE 3+ VIEW COMPARISON:  None Available. FINDINGS: There is no evidence of fracture or dislocation. There is no evidence of arthropathy or other focal bone abnormality. Soft tissues are unremarkable. IMPRESSION: Negative. Electronically Signed   By: Jasmine Pang M.D.   On: 06/20/2023 02:52    Procedures Procedures    Medications Ordered in ED Medications  acetaminophen (TYLENOL) tablet 650 mg (650 mg Oral Given 06/20/23 0227)    ED Course/ Medical Decision Making/ A&P                                 Medical Decision Making Amount and/or Complexity of Data Reviewed Labs: ordered. Decision-making details documented in ED Course. Radiology: ordered and independent interpretation performed. Decision-making details documented in ED Course. ECG/medicine tests:  ordered and independent interpretation performed. Decision-making details documented in ED Course.  Risk OTC drugs.   Blunt trauma to the right thumb with reduced range of motion and small subungual hematoma.  Neurovascular intact. Tetanus up-to-date.  Obtained to rule out fracture.  X-ray negative for fracture or dislocation.  Results reviewed and interpreted by me.  Suspect contusion.  Recommend ice, anti-inflammatories, spica splint for comfort.  Offered drainage of subungual hematoma but she declines at this time.  Follow-up with primary doctor for recheck in 2 days.  Return to the ED with new or worsening symptoms.        Final  Clinical Impression(s) / ED Diagnoses Final diagnoses:  None    Rx / DC Orders ED Discharge Orders     None         Coriana Angello, Jeannett Senior, MD 06/20/23 [phone]

## 2023-06-20 NOTE — Discharge Instructions (Signed)
Your x-rays are negative.  Take the anti-inflammatories as prescribed and use ice.  Follow-up with your doctor for recheck.  Return to the ED if you desire to have the blood under your nail drained. Return to the ED with new or worsening symptoms.

## 2023-08-16 ENCOUNTER — Ambulatory Visit
Admission: RE | Admit: 2023-08-16 | Discharge: 2023-08-16 | Disposition: A | Discharge status: Home / Self Care | Payer: 59 | Source: Ambulatory Visit | Attending: Internal Medicine | Admitting: Internal Medicine

## 2023-08-16 VITALS — BP 110/75 | HR 86 | Temp 98.2°F | Resp 16

## 2023-08-16 DIAGNOSIS — J0110 Acute frontal sinusitis, unspecified: Secondary | ICD-10-CM | POA: Diagnosis not present

## 2023-08-16 MED ORDER — AMOXICILLIN-POT CLAVULANATE 875-125 MG PO TABS: 1.0000 | ORAL_TABLET | Freq: Two times a day (BID) | ORAL | 0 refills | Status: AC

## 2023-08-16 MED ORDER — FLUTICASONE PROPIONATE 50 MCG/ACT NA SUSP: 1.0000 | Freq: Every day | NASAL | 0 refills | Status: AC

## 2023-08-16 NOTE — ED Provider Notes (Signed)
[facility]    CSN: [id number] Arrival date & time: 08/16/23  3086      History   Chief Complaint Chief Complaint  Patient presents with   Headache    Experiencing congestion and sinus headaches since Sunday, Dec 22nd. - Entered by patient    HPI Jessica Burnett is a 27 y.o. female  presents for evaluation of URI symptoms for 7 days. Patient reports associated symptoms of sinus pressure/pain with congestion, sinus headache, purulent nasal discharge. Denies N/V/D, fevers, ear pain, sore throat, body aches, shortness of breath. Patient does have a hx of asthma.  Denies any wheezing or shortness of breath since symptom onset.  Patient does not have a history of smoking.  Pt has taken nasal rinses/nasal saline spray/steam showers/ibuprofen and Tylenol OTC for symptoms. Pt has no other concerns at this time.    Headache Associated symptoms: congestion and sinus pressure     Past Medical History:  Diagnosis Date   Asthma     Patient Active Problem List   Diagnosis Date Noted   Postpartum care following vaginal delivery 10/17 06/06/2020   SVD (spontaneous vaginal delivery) 06/06/2020   Perineal laceration, second degree, sulcus 06/06/2020    Past Surgical History:  Procedure Laterality Date   EXCISION VAGINAL CYST Right 08/04/2015   Procedure: EXCISION RIGHT VAGINAL Wall CYST;  Surgeon: Noland Fordyce, MD;  Location: [facility];  Service: Gynecology;  Laterality: Right;   INCISION AND DRAINAGE ABSCESS Left 10/25/2014   Procedure: INCISION AND DRAINAGE ABSCESS/Removal of Left Skene's Gland Cyst;  Surgeon: Noland Fordyce, MD;  Location: [facility];  Service: Gynecology;  Laterality: Left;    OB History     Gravida  1   Para  1   Term  1   Preterm      AB      Living  1      SAB      IAB      Ectopic      Multiple  0   Live Births  1            Home Medications    Prior to Admission medications   Medication Sig Start Date End Date Taking?  Authorizing Provider  amoxicillin-clavulanate (AUGMENTIN) 875-125 MG tablet Take 1 tablet by mouth every 12 (twelve) hours. 08/16/23  Yes Radford Pax, NP  fluticasone (FLONASE) 50 MCG/ACT nasal spray Place 1 spray into both nostrils daily. 08/16/23  Yes Radford Pax, NP  Prenatal Vit-Fe Fumarate-FA (PRENATAL MULTIVITAMIN) TABS tablet Take 1 tablet by mouth daily at 12 noon.   Yes [provider]  albuterol (PROVENTIL) (2.5 MG/3ML) 0.083% nebulizer solution Take 2.5 mg by nebulization every 6 (six) hours as needed for wheezing or shortness of breath.    [provider]  benzocaine-Menthol (DERMOPLAST) 20-0.5 % AERO Apply 1 application topically as needed for irritation (perineal discomfort). 06/07/20   Neta Mends, CNM  coconut oil OIL Apply 1 application topically as needed. 06/07/20   Neta Mends, CNM    Family History History reviewed. No pertinent family history.  Social History Social History   Tobacco Use   Smoking status: Never   Smokeless tobacco: Never  Vaping Use   Vaping status: Never Used  Substance Use Topics   Alcohol use: Not Currently    Comment: occasions   Drug use: No     Allergies   Patient has no known allergies.   Review of Systems Review of Systems  HENT:  Positive for congestion, sinus pressure and sinus pain.   Neurological:  Positive for headaches.     Physical Exam Triage Vital Signs ED Triage Vitals  Encounter Vitals Group     BP 08/16/23 0913 110/75     Systolic BP Percentile --      Diastolic BP Percentile --      Pulse Rate 08/16/23 0913 86     Resp 08/16/23 0913 16     Temp 08/16/23 0913 98.2 F (36.8 C)     Temp Source 08/16/23 0913 Oral     SpO2 08/16/23 0913 97 %     Weight --      Height --      Head Circumference --      Peak Flow --      Pain Score 08/16/23 0909 7     Pain Loc --      Pain Education --      Exclude from Growth Chart --    No data found.  Updated Vital Signs BP 110/75 (BP  Location: Right Arm)   Pulse 86   Temp 98.2 F (36.8 C) (Oral)   Resp 16   LMP 07/03/2023 (Exact Date)   SpO2 97%   Breastfeeding No   Visual Acuity Right Eye Distance:   Left Eye Distance:   Bilateral Distance:    Right Eye Near:   Left Eye Near:    Bilateral Near:     Physical Exam Vitals and nursing note reviewed.  Constitutional:      General: She is not in acute distress.    Appearance: She is well-developed. She is not ill-appearing.  HENT:     Head: Normocephalic and atraumatic.     Right Ear: Tympanic membrane and ear canal normal.     Left Ear: Tympanic membrane and ear canal normal.     Nose: Congestion present.     Right Turbinates: Swollen and pale.     Left Turbinates: Swollen and pale.     Right Sinus: No maxillary sinus tenderness or frontal sinus tenderness.     Left Sinus: Frontal sinus tenderness present. No maxillary sinus tenderness.     Mouth/Throat:     Mouth: Mucous membranes are moist.     Pharynx: Oropharynx is clear. Uvula midline. No posterior oropharyngeal erythema.     Tonsils: No tonsillar exudate or tonsillar abscesses.  Eyes:     Conjunctiva/sclera: Conjunctivae normal.     Pupils: Pupils are equal, round, and reactive to light.  Cardiovascular:     Rate and Rhythm: Normal rate and regular rhythm.     Heart sounds: Normal heart sounds.  Pulmonary:     Effort: Pulmonary effort is normal.     Breath sounds: Normal breath sounds.  Musculoskeletal:     Cervical back: Normal range of motion and neck supple.  Lymphadenopathy:     Cervical: No cervical adenopathy.  Skin:    General: Skin is warm and dry.  Neurological:     General: No focal deficit present.     Mental Status: She is alert and oriented to person, place, and time.  Psychiatric:        Mood and Affect: Mood normal.        Behavior: Behavior normal.      UC Treatments / Results  Labs (all labs ordered are listed, but only abnormal results are displayed) Labs  Reviewed - No data to display  EKG   Radiology No results found.  Procedures Procedures (including critical care time)  Medications Ordered in UC Medications - No data to display  Initial Impression / Assessment and Plan / UC Course  I have reviewed the triage vital signs and the nursing notes.  Pertinent labs & imaging results that were available during my care of the patient were reviewed by me and considered in my medical decision making (see chart for details).     Reviewed exam and symptoms with patient.  No red flags.  Start Augmentin and Flonase.  Continue nasal rinses/saline sprays as needed.  PCP follow-up as symptoms do not improve.  ER precautions reviewed. Final Clinical Impressions(s) / UC Diagnoses   Final diagnoses:  Acute frontal sinusitis, recurrence not specified     Discharge Instructions      Start Augmentin twice daily for 7 days.  Flonase daily.  Continue nasal rinses and saline sprays as needed.  Lots of rest and fluids.  Please follow-up with your PCP if your symptoms do not improve.  Please go to the ER for any worsening symptoms.  I hope you feel better soon!    ED Prescriptions     Medication Sig Dispense Auth. Provider   amoxicillin-clavulanate (AUGMENTIN) 875-125 MG tablet Take 1 tablet by mouth every 12 (twelve) hours. 14 tablet Radford Pax, NP   fluticasone (FLONASE) 50 MCG/ACT nasal spray Place 1 spray into both nostrils daily. 15.8 mL Radford Pax, NP      PDMP not reviewed this encounter.   Radford Pax, NP 08/16/23 [phone]

## 2023-08-16 NOTE — Discharge Instructions (Signed)
Start Augmentin twice daily for 7 days.  Flonase daily.  Continue nasal rinses and saline sprays as needed.  Lots of rest and fluids.  Please follow-up with your PCP if your symptoms do not improve.  Please go to the ER for any worsening symptoms.  I hope you feel better soon!

## 2023-08-16 NOTE — ED Triage Notes (Signed)
Pt presents to UC for c/o nasal congestion x1 week and headache/facial sinus pain x4 days. Pt has used Tylenol, ibuprofen, steam bath, nasal spray, saline w/o relief.

## 2024-03-09 ENCOUNTER — Other Ambulatory Visit: Payer: Self-pay

## 2024-03-09 ENCOUNTER — Ambulatory Visit
Admission: RE | Admit: 2024-03-09 | Discharge: 2024-03-09 | Disposition: A | Discharge status: Home / Self Care | Attending: Physician Assistant | Admitting: Physician Assistant

## 2024-03-09 VITALS — BP 113/75 | HR 77 | Temp 98.1°F | Resp 18 | Ht 63.0 in | Wt 153.0 lb

## 2024-03-09 DIAGNOSIS — R0981 Nasal congestion: Secondary | ICD-10-CM | POA: Diagnosis not present

## 2024-03-09 DIAGNOSIS — R051 Acute cough: Secondary | ICD-10-CM | POA: Diagnosis not present

## 2024-03-09 DIAGNOSIS — J209 Acute bronchitis, unspecified: Secondary | ICD-10-CM

## 2024-03-09 LAB — POC COVID19/FLU A&B COMBO
Covid Antigen, POC: NEGATIVE
Influenza A Antigen, POC: NEGATIVE
Influenza B Antigen, POC: NEGATIVE

## 2024-03-09 LAB — POCT RAPID STREP A (OFFICE): Rapid Strep A Screen: NEGATIVE

## 2024-03-09 MED ORDER — AZITHROMYCIN 250 MG PO TABS: ORAL_TABLET | ORAL | 0 refills | Status: AC

## 2024-03-09 MED ORDER — PREDNISONE 20 MG PO TABS
40.0000 mg | ORAL_TABLET | Freq: Every day | ORAL | 0 refills | Status: AC
Start: 2024-03-09 — End: 2024-03-14

## 2024-03-09 NOTE — ED Triage Notes (Signed)
 Pt presents with complaints of nasal congestion, sinus pressure on the left side of face, fatigue, and cough x 4 days. States she is having pain behind her left eye, in left cheek area, and left jaw. Currently rates overall pain a 5/10. Mucus is now yellow and thick. Hx of asthma. States she is feeling a little SOB. Voices concerns as she is unsure if left jaw pain is related to wisdom teeth. Denies fevers at home. OTC Ibuprofen  taken on Friday 7/18.

## 2024-03-09 NOTE — ED Provider Notes (Signed)
 [facility]    CSN: [id number] Arrival date & time: 03/09/24  1327      History   Chief Complaint Chief Complaint  Patient presents with  . Nasal Congestion    Experiencing nasal, ear, and head congestion for 4 days now. Mucus was clear for 6 days, turned yellow and thick Friday. Cough was normal for 6 days, turn hard and mucus developed on Friday - Entered by patient  . Sinus Pressure    HPI Jessica Burnett is a 28 y.o. female.   HPI  Pt presents today with concerns for nasal congestion, productive cough She states this started last Sun but was rather mild She states her symptoms progressed to yellow mucus, nasal congestion, productive coughing, sinus pressure on the left side, tooth pain  She reports some SOB as well Interventions: Ibuprofen  on Friday for headache. Cough drops She reports chronic hx of asthma   Past Medical History:  Diagnosis Date  . Asthma     Patient Active Problem List   Diagnosis Date Noted  . Postpartum care following vaginal delivery 10/17 06/06/2020  . SVD (spontaneous vaginal delivery) 06/06/2020  . Perineal laceration, second degree, sulcus 06/06/2020    Past Surgical History:  Procedure Laterality Date  . EXCISION VAGINAL CYST Right 08/04/2015   Procedure: EXCISION RIGHT VAGINAL Wall CYST;  Surgeon: Burnard Bowers, MD;  Location: [facility];  Service: Gynecology;  Laterality: Right;  . INCISION AND DRAINAGE ABSCESS Left 10/25/2014   Procedure: INCISION AND DRAINAGE ABSCESS/Removal of Left Skene's Gland Cyst;  Surgeon: Burnard Bowers, MD;  Location: [facility];  Service: Gynecology;  Laterality: Left;    OB History     Gravida  1   Para  1   Term  1   Preterm      AB      Living  1      SAB      IAB      Ectopic      Multiple  0   Live Births  1            Home Medications    Prior to Admission medications   Medication Sig Start Date End Date Taking? Authorizing Provider  azithromycin  (ZITHROMAX ) 250 MG  tablet Take 2 tablets (500 mg total) by mouth daily for 1 day, THEN 1 tablet (250 mg total) daily for 4 days. 03/09/24 03/14/24 Yes Kimothy Kishimoto E, PA-C  predniSONE  (DELTASONE ) 20 MG tablet Take 2 tablets (40 mg total) by mouth daily for 5 days. 03/09/24 03/14/24 Yes Scotland Dost E, PA-C  albuterol (PROVENTIL) (2.5 MG/3ML) 0.083% nebulizer solution Take 2.5 mg by nebulization every 6 (six) hours as needed for wheezing or shortness of breath.    [provider]  amoxicillin -clavulanate (AUGMENTIN ) 875-125 MG tablet Take 1 tablet by mouth every 12 (twelve) hours. 08/16/23   Mayer, Jodi R, NP  benzocaine -Menthol  (DERMOPLAST) 20-0.5 % AERO Apply 1 application topically as needed for irritation (perineal discomfort). 06/07/20   Paul, Daniela C, CNM  coconut oil OIL Apply 1 application topically as needed. 06/07/20   Paul, Daniela C, CNM  fluticasone  (FLONASE ) 50 MCG/ACT nasal spray Place 1 spray into both nostrils daily. 08/16/23   Mayer, Jodi R, NP  Prenatal Vit-Fe Fumarate-FA (PRENATAL MULTIVITAMIN) TABS tablet Take 1 tablet by mouth daily at 12 noon.    [provider]    Family History History reviewed. No pertinent family history.  Social History Social History   Tobacco Use  .  Smoking status: Never  . Smokeless tobacco: Never  Vaping Use  . Vaping status: Never Used  Substance Use Topics  . Alcohol use: Not Currently    Comment: occasions  . Drug use: No     Allergies   Patient has no known allergies.   Review of Systems Review of Systems  Constitutional:  Positive for chills (Thurs and Friday). Negative for fever.  HENT:  Positive for congestion and tinnitus (in left ear- she reports they went swimming yesterdayt). Negative for ear pain, postnasal drip and sore throat.   Respiratory:  Positive for cough. Negative for shortness of breath and wheezing.   Gastrointestinal:  Negative for diarrhea, nausea and vomiting.  Musculoskeletal:  Negative for myalgias.   Neurological:  Positive for headaches.     Physical Exam Triage Vital Signs ED Triage Vitals  Encounter Vitals Group     BP 03/09/24 1356 113/75     Girls Systolic BP Percentile --      Girls Diastolic BP Percentile --      Boys Systolic BP Percentile --      Boys Diastolic BP Percentile --      Pulse Rate 03/09/24 1356 77     Resp 03/09/24 1356 18     Temp 03/09/24 1356 98.1 F (36.7 C)     Temp Source 03/09/24 1356 Oral     SpO2 03/09/24 1356 97 %     Weight 03/09/24 1356 153 lb (69.4 kg)     Height 03/09/24 1356 5' 3 (1.6 m)     Head Circumference --      Peak Flow --      Pain Score 03/09/24 1411 5     Pain Loc --      Pain Education --      Exclude from Growth Chart --    No data found.  Updated Vital Signs BP 113/75 (BP Location: Right Arm)   Pulse 77   Temp 98.1 F (36.7 C) (Oral)   Resp 18   Ht 5' 3 (1.6 m)   Wt 153 lb (69.4 kg)   LMP 03/06/2024 (Exact Date)   SpO2 97%   BMI 27.10 kg/m   Visual Acuity Right Eye Distance:   Left Eye Distance:   Bilateral Distance:    Right Eye Near:   Left Eye Near:    Bilateral Near:     Physical Exam Vitals reviewed.  Constitutional:      General: She is awake.     Appearance: Normal appearance. She is well-developed and well-groomed.  HENT:     Head: Normocephalic and atraumatic.     Right Ear: Hearing, tympanic membrane and ear canal normal.     Left Ear: Hearing, tympanic membrane and ear canal normal.     Mouth/Throat:     Lips: Pink.     Mouth: Mucous membranes are moist.     Pharynx: Oropharynx is clear. Uvula midline. No pharyngeal swelling, oropharyngeal exudate, posterior oropharyngeal erythema, uvula swelling or postnasal drip.  Cardiovascular:     Rate and Rhythm: Normal rate and regular rhythm.     Pulses: Normal pulses.          Radial pulses are 2+ on the right side and 2+ on the left side.     Heart sounds: Normal heart sounds. No murmur heard.    No friction rub. No gallop.   Pulmonary:     Effort: Pulmonary effort is normal.     Breath sounds: Normal  breath sounds. No decreased air movement. No decreased breath sounds, wheezing, rhonchi or rales.  Musculoskeletal:     Cervical back: Normal range of motion and neck supple.  Lymphadenopathy:     Head:     Right side of head: No submental, submandibular or preauricular adenopathy.     Left side of head: No submental, submandibular or preauricular adenopathy.     Cervical:     Right cervical: No superficial cervical adenopathy.    Left cervical: No superficial cervical adenopathy.     Upper Body:     Right upper body: No supraclavicular adenopathy.     Left upper body: No supraclavicular adenopathy.  Neurological:     Mental Status: She is alert.  Psychiatric:        Behavior: Behavior is cooperative.      UC Treatments / Results  Labs (all labs ordered are listed, but only abnormal results are displayed) Labs Reviewed  POCT RAPID STREP A (OFFICE) - Normal  POC COVID19/FLU A&B COMBO    EKG   Radiology No results found.  Procedures Procedures (including critical care time)  Medications Ordered in UC Medications - No data to display  Initial Impression / Assessment and Plan / UC Course  I have reviewed the triage vital signs and the nursing notes.  Pertinent labs & imaging results that were available during my care of the patient were reviewed by me and considered in my medical decision making (see chart for details).      Final Clinical Impressions(s) / UC Diagnoses   Final diagnoses:  Acute cough  Sinus congestion     Discharge Instructions      Your COVID, flu and strep testing were negative.  At this time I suspect you have developed a mild case of bronchitis from an Upper Respiratory infection.   I have sent in a script for a Zpack (an antibiotic called Azithromycin ) and Prednisone . Please take these as directed to help with your symptoms  You can use the following  medications and measures to help yourself feel better until your body fights this off: DayQuil/NyQuil, TheraFlu, Alka-Seltzer  (these medications typically have the same active ingredients in them so you can choose whichever one you prefer and take consistently during the day and night according to the manufactures instructions.) Flonase  A daily antihistamine such as Zyrtec, Claritin, Allegra per your preference.  Please choose 1 and take consistently. Increased fluids.  It is recommended that you take in at least 64 ounces of water per day when you are not sick so it is important to increase this when you are sick and your body may be running fever. Rest Cough drops Chloraseptic throat spray to help with sore throat Nasal saline spray or nasal flushes to help with congestion and runny nose  If your symptoms seem like they are getting worse over the next 5 to 7 days or not improving you can always follow-up here in urgent care or go to your primary care provider for further management. Go to the ER if you begin to have more serious symptoms such as shortness of breath, trouble breathing, loss of consciousness, swelling around the eyes, high fever, severe lasting headaches, vision changes or neck pain/stiffness.       ED Prescriptions     Medication Sig Dispense Auth. Provider   azithromycin  (ZITHROMAX ) 250 MG tablet Take 2 tablets (500 mg total) by mouth daily for 1 day, THEN 1 tablet (250 mg total) daily for 4  days. 6 each Calvary Difranco E, PA-C   predniSONE  (DELTASONE ) 20 MG tablet Take 2 tablets (40 mg total) by mouth daily for 5 days. 10 tablet Sydnie Sigmund E, PA-C      PDMP not reviewed this encounter.

## 2024-03-09 NOTE — Discharge Instructions (Addendum)
 Your COVID, flu and strep testing were negative.  At this time I suspect you have developed a mild case of bronchitis from an Upper Respiratory infection.   I have sent in a script for a Zpack (an antibiotic called Azithromycin ) and Prednisone . Please take these as directed to help with your symptoms  You can use the following medications and measures to help yourself feel better until your body fights this off: DayQuil/NyQuil, TheraFlu, Alka-Seltzer  (these medications typically have the same active ingredients in them so you can choose whichever one you prefer and take consistently during the day and night according to the manufactures instructions.) Flonase  A daily antihistamine such as Zyrtec, Claritin, Allegra per your preference.  Please choose 1 and take consistently. Increased fluids.  It is recommended that you take in at least 64 ounces of water per day when you are not sick so it is important to increase this when you are sick and your body may be running fever. Rest Cough drops Chloraseptic throat spray to help with sore throat Nasal saline spray or nasal flushes to help with congestion and runny nose  If your symptoms seem like they are getting worse over the next 5 to 7 days or not improving you can always follow-up here in urgent care or go to your primary care provider for further management. Go to the ER if you begin to have more serious symptoms such as shortness of breath, trouble breathing, loss of consciousness, swelling around the eyes, high fever, severe lasting headaches, vision changes or neck pain/stiffness.
